# Patient Record
Sex: Male | Born: 1985 | Hispanic: Yes | Marital: Single | Smoking: Never smoker
Health system: Southern US, Community
[De-identification: ages and names within clinical notes are randomized; demographics above are authoritative.]

## PROBLEM LIST (undated history)

## (undated) DIAGNOSIS — F32A Depression, unspecified: Secondary | ICD-10-CM

## (undated) DIAGNOSIS — T7840XA Allergy, unspecified, initial encounter: Secondary | ICD-10-CM

## (undated) DIAGNOSIS — J101 Influenza due to other identified influenza virus with other respiratory manifestations: Secondary | ICD-10-CM

## (undated) DIAGNOSIS — F419 Anxiety disorder, unspecified: Secondary | ICD-10-CM

## (undated) DIAGNOSIS — F319 Bipolar disorder, unspecified: Secondary | ICD-10-CM

## (undated) HISTORY — PX: ADENOIDECTOMY: SUR15

## (undated) HISTORY — DX: Depression, unspecified: F32.A

## (undated) HISTORY — PX: WISDOM TOOTH EXTRACTION: SHX21

## (undated) HISTORY — DX: Allergy, unspecified, initial encounter: T78.40XA

---

## 2013-03-08 ENCOUNTER — Emergency Department (HOSPITAL_COMMUNITY): Payer: BC Managed Care – PPO

## 2013-03-08 ENCOUNTER — Emergency Department (HOSPITAL_COMMUNITY)
Admission: EM | Admit: 2013-03-08 | Discharge: 2013-03-09 | Disposition: A | Discharge status: Home / Self Care | Payer: BC Managed Care – PPO | Attending: Emergency Medicine | Admitting: Emergency Medicine

## 2013-03-08 ENCOUNTER — Encounter (HOSPITAL_COMMUNITY): Payer: Self-pay | Admitting: Emergency Medicine

## 2013-03-08 DIAGNOSIS — R110 Nausea: Secondary | ICD-10-CM | POA: Insufficient documentation

## 2013-03-08 DIAGNOSIS — IMO0001 Reserved for inherently not codable concepts without codable children: Secondary | ICD-10-CM | POA: Insufficient documentation

## 2013-03-08 DIAGNOSIS — R6889 Other general symptoms and signs: Secondary | ICD-10-CM

## 2013-03-08 DIAGNOSIS — J111 Influenza due to unidentified influenza virus with other respiratory manifestations: Secondary | ICD-10-CM | POA: Insufficient documentation

## 2013-03-08 DIAGNOSIS — R197 Diarrhea, unspecified: Secondary | ICD-10-CM | POA: Insufficient documentation

## 2013-03-08 HISTORY — DX: Influenza due to other identified influenza virus with other respiratory manifestations: J10.1

## 2013-03-08 MED ORDER — ACETAMINOPHEN 325 MG PO TABS
650.0000 mg | ORAL_TABLET | Freq: Once | ORAL | Status: AC
  Administered 2013-03-09: 650 mg via ORAL
  Filled 2013-03-08: qty 2

## 2013-03-08 MED ORDER — ONDANSETRON HCL 4 MG/2ML IJ SOLN
4.0000 mg | Freq: Once | INTRAMUSCULAR | Status: AC
  Administered 2013-03-09: 4 mg via INTRAVENOUS
  Filled 2013-03-08: qty 2

## 2013-03-08 MED ORDER — SODIUM CHLORIDE 0.9 % IV BOLUS (SEPSIS)
1000.0000 mL | Freq: Once | INTRAVENOUS | Status: AC
  Administered 2013-03-09: 1000 mL via INTRAVENOUS

## 2013-03-08 MED ORDER — SODIUM CHLORIDE 0.9 % IV BOLUS (SEPSIS): 1000.0000 mL | Freq: Once | INTRAVENOUS | Status: DC

## 2013-03-08 NOTE — ED Provider Notes (Signed)
CSN: [id number]     Arrival date & time 03/08/13  2217 History  This chart was scribed for non-physician practitioner Marlon Pel, PA-C, working with Enid Skeens, MD, by Yevette Edwards, ED Scribe. This patient was seen in room WTR2/WLPT2 and the patient's care was started at 11:33 PM.   First MD Initiated Contact with Patient 03/08/13 2329     Chief Complaint  Patient presents with  . Influenza   The history is provided by the patient. No language interpreter was used.   HPI Comments: Kenneth Crawford is a 27 y.o. male who presents to the Emergency Department complaining of a fever which began yesterday evening. The pt measured his maximum temperature at home at 103 F, and he treated it with IBU. In the ED, his temperature is 99.7 F. The pt has experienced a headache, congestion, rhinorrhea, sore throat, cough, nausea, diarrhea, and myalgia. He has treated his symptoms with Nyquil without relief. The pt denies emesis and otalgia. 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 He received the influenza vaccination this year. The pt has a h/o H1N1 and two bouts of pneumonia. He is a non-smoker.   Past Medical History  Diagnosis Date  . H1N1 influenza     2009   History reviewed. No pertinent past surgical history. No family history on file. History  Substance Use Topics  . Smoking status: Never Smoker   . Smokeless tobacco: Not on file  . Alcohol Use: Yes     Comment: occ    Review of Systems  Constitutional: Positive for fever.  HENT: Positive for congestion, rhinorrhea and sore throat. Negative for ear pain.   Respiratory: Positive for cough.   Gastrointestinal: Positive for nausea and diarrhea. Negative for vomiting.  Musculoskeletal: Positive for myalgias.  Neurological: Positive for headaches.  All other systems reviewed and are negative.   Allergies  Sulfa antibiotics  Home Medications   Current Outpatient Rx  Name  Route  Sig   Dispense  Refill  . DM-Doxylamine-Acetaminophen (NYQUIL COLD & FLU PO)   Oral   Take 30 mLs by mouth every 8 (eight) hours as needed (cold).         Marland Kitchen ibuprofen (ADVIL,MOTRIN) 200 MG tablet   Oral   Take 200 mg by mouth every 6 (six) hours as needed (pain).           Triage Vitals: BP 103/71  Pulse 129  Temp(Src) 99.7 F (37.6 C) (Oral)  Resp 20  Wt 135 lb (61.236 kg)  SpO2 99%  Physical Exam  Nursing note and vitals reviewed. Constitutional: He is oriented to person, place, and time. He appears well-developed and well-nourished. No distress.  HENT:  Head: Normocephalic and atraumatic.  Eyes: EOM are normal.  Neck: Neck supple. No tracheal deviation present.  Cardiovascular: Normal rate.   Pulmonary/Chest: Effort normal. No respiratory distress.  Musculoskeletal: Normal range of motion.  Neurological: He is alert and oriented to person, place, and time.  Skin: Skin is warm and dry.  Psychiatric: He has a normal mood and affect. His behavior is normal.    ED Course  Procedures (including critical care time)  DIAGNOSTIC STUDIES: Oxygen Saturation is 99% on room air, normal by my interpretation.    COORDINATION OF CARE:  11:37 PM- Discussed treatment plan with patient, and the patient agreed to the plan.   Labs Review Labs Reviewed  CBC WITH DIFFERENTIAL - Abnormal; Notable for the  following:    Neutrophils Relative % 79 (*)    Lymphocytes Relative 11 (*)    All other components within normal limits  BASIC METABOLIC PANEL - Abnormal; Notable for the following:    Glucose, Bld 102 (*)    All other components within normal limits   Imaging Review Dg Chest 2 View  03/09/2013   CLINICAL DATA:  Cough, fever  EXAM: CHEST  2 VIEW  COMPARISON:  None.  FINDINGS: The cardiac and mediastinal silhouettes are within normal limits.  The lungs are normally inflated. No airspace consolidation, pleural effusion, or pulmonary edema is identified. There is no pneumothorax.  No  acute osseous abnormality identified.  IMPRESSION: No active cardiopulmonary disease.   Electronically Signed   By: Rise Mu M.D.   On: 03/09/2013 00:12    EKG Interpretation   None       MDM   1. Flu-like symptoms    Patient given 2L NS in the ER as well as Tylenol and Zofran. He is feeling much better. His labs and chest xray are also reassuring.  Patient will be treated symptomatically for home.  Will give note for work, Zofran ODT and Ultram.  27 y.o.[name] evaluation in the Emergency Department is complete. It has been determined that no acute conditions requiring further emergency intervention are present at this time. The patient/guardian have been advised of the diagnosis and plan. We have discussed signs and symptoms that warrant return to the ED, such as changes or worsening in symptoms.  Vital signs are stable at discharge. Filed Vitals:   03/09/13 0157  BP: 115/50  Pulse: 82  Temp: 99 F (37.2 C)  Resp: 16    Patient/guardian has voiced understanding and agreed to follow-up with the PCP or specialist.       Dorthula Matas, PA-C 03/09/13 [phone]

## 2013-03-08 NOTE — ED Notes (Signed)
Pt c/o fever, +nausea, sore throat, cough, body aches, chills onset Thursday night. Pt is employed at [employer]

## 2013-03-09 LAB — BASIC METABOLIC PANEL
BUN: 10 mg/dL (ref 6–23)
CO2: 28 mEq/L (ref 19–32)
Calcium: 9.8 mg/dL (ref 8.4–10.5)
Creatinine, Ser: 1.12 mg/dL (ref 0.50–1.35)
GFR calc non Af Amer: >90 mL/min (ref >90)
Glucose, Bld: 102 mg/dL — ABNORMAL HIGH (ref 70–99)

## 2013-03-09 LAB — CBC WITH DIFFERENTIAL/PLATELET
Basophils Absolute: 0 10*3/uL (ref 0.0–0.1)
Eosinophils Absolute: 0 10*3/uL (ref 0.0–0.7)
Eosinophils Relative: 0 % (ref 0–5)
HCT: 44 % (ref 39.0–52.0)
Lymphocytes Relative: 11 % — ABNORMAL LOW (ref 12–46)
Lymphs Abs: 0.9 10*3/uL (ref 0.7–4.0)
MCH: 28.6 pg (ref 26.0–34.0)
MCHC: 34.8 g/dL (ref 30.0–36.0)
MCV: 82.2 fL (ref 78.0–100.0)
Monocytes Absolute: 0.8 10*3/uL (ref 0.1–1.0)
Monocytes Relative: 10 % (ref 3–12)
RDW: 12.3 % (ref 11.5–15.5)
WBC: 7.7 10*3/uL (ref 4.0–10.5)

## 2013-03-09 MED ORDER — ONDANSETRON HCL 4 MG PO TABS: 4.0000 mg | ORAL_TABLET | Freq: Four times a day (QID) | ORAL | Status: DC

## 2013-03-09 MED ORDER — SODIUM CHLORIDE 0.9 % IV BOLUS (SEPSIS)
1000.0000 mL | Freq: Once | INTRAVENOUS | Status: AC
  Administered 2013-03-09: 1000 mL via INTRAVENOUS

## 2013-03-09 MED ORDER — TRAMADOL HCL 50 MG PO TABS: 50.0000 mg | ORAL_TABLET | Freq: Four times a day (QID) | ORAL | Status: DC | PRN

## 2013-03-09 NOTE — ED Provider Notes (Signed)
Medical screening examination/treatment/procedure(s) were conducted as a shared visit with non-physician practitioner(s) or resident and myself. I personally evaluated the patient during the encounter and agree with the findings and plan unless otherwise indicated.  I have personally reviewed any xrays and/ or EKG's with the provider and I agree with interpretation.  Fever, chills, cough, body aches. 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 Exam mild dry mm, tachycardia, supple neck/ full rom, non toxic appearing, no petechia or purpura.  Fever, Flu like illness   Enid Skeens, MD 03/09/13 [phone]

## 2014-03-01 ENCOUNTER — Encounter (HOSPITAL_COMMUNITY): Payer: Self-pay

## 2014-03-01 ENCOUNTER — Emergency Department (HOSPITAL_COMMUNITY)
Admission: EM | Admit: 2014-03-01 | Discharge: 2014-03-02 | Disposition: A | Discharge status: Home / Self Care | Payer: BC Managed Care – PPO | Attending: Emergency Medicine | Admitting: Emergency Medicine

## 2014-03-01 DIAGNOSIS — F22 Delusional disorders: Secondary | ICD-10-CM | POA: Insufficient documentation

## 2014-03-01 DIAGNOSIS — Z8709 Personal history of other diseases of the respiratory system: Secondary | ICD-10-CM | POA: Insufficient documentation

## 2014-03-01 DIAGNOSIS — F419 Anxiety disorder, unspecified: Secondary | ICD-10-CM | POA: Insufficient documentation

## 2014-03-01 DIAGNOSIS — Z79899 Other long term (current) drug therapy: Secondary | ICD-10-CM | POA: Insufficient documentation

## 2014-03-01 DIAGNOSIS — F319 Bipolar disorder, unspecified: Secondary | ICD-10-CM | POA: Insufficient documentation

## 2014-03-01 DIAGNOSIS — F315 Bipolar disorder, current episode depressed, severe, with psychotic features: Secondary | ICD-10-CM

## 2014-03-01 LAB — CBC
HCT: 44.2 % (ref 39.0–52.0)
HEMOGLOBIN: 15.4 g/dL (ref 13.0–17.0)
MCH: 29 pg (ref 26.0–34.0)
MCHC: 34.8 g/dL (ref 30.0–36.0)
MCV: 83.2 fL (ref 78.0–100.0)
PLATELETS: 259 10*3/uL (ref 150–400)
RBC: 5.31 MIL/uL (ref 4.22–5.81)
RDW: 12.7 % (ref 11.5–15.5)
WBC: 5.3 10*3/uL (ref 4.0–10.5)

## 2014-03-01 LAB — COMPREHENSIVE METABOLIC PANEL
ALK PHOS: 102 U/L (ref 39–117)
ALT: 15 U/L (ref 0–53)
AST: 19 U/L (ref 0–37)
Albumin: 4.9 g/dL (ref 3.5–5.2)
Anion gap: 14 (ref 5–15)
BUN: 14 mg/dL (ref 6–23)
CHLORIDE: 102 meq/L (ref 96–112)
CO2: 26 meq/L (ref 19–32)
Calcium: 10 mg/dL (ref 8.4–10.5)
Creatinine, Ser: 1 mg/dL (ref 0.50–1.35)
GLUCOSE: 101 mg/dL — AB (ref 70–99)
Potassium: 4 mEq/L (ref 3.7–5.3)
SODIUM: 142 meq/L (ref 137–147)
Total Bilirubin: 0.5 mg/dL (ref 0.3–1.2)
Total Protein: 8.3 g/dL (ref 6.0–8.3)

## 2014-03-01 LAB — RAPID URINE DRUG SCREEN, HOSP PERFORMED
AMPHETAMINES: NOT DETECTED
Barbiturates: NOT DETECTED
Benzodiazepines: NOT DETECTED
Cocaine: NOT DETECTED
Opiates: NOT DETECTED
Tetrahydrocannabinol: NOT DETECTED

## 2014-03-01 LAB — ACETAMINOPHEN LEVEL

## 2014-03-01 LAB — SALICYLATE LEVEL: Salicylate Lvl: <2 mg/dL — ABNORMAL LOW (ref 2.8–20.0)

## 2014-03-01 LAB — ETHANOL: Alcohol, Ethyl (B): <11 mg/dL (ref 0–11)

## 2014-03-01 MED ORDER — IBUPROFEN 200 MG PO TABS: 600.0000 mg | ORAL_TABLET | Freq: Three times a day (TID) | ORAL | Status: DC | PRN

## 2014-03-01 MED ORDER — RISPERIDONE 0.5 MG PO TABS
0.5000 mg | ORAL_TABLET | Freq: Two times a day (BID) | ORAL | Status: DC
  Administered 2014-03-01 – 2014-03-02 (×3): 0.5 mg via ORAL
  Filled 2014-03-01 (×3): qty 1

## 2014-03-01 MED ORDER — ALUM & MAG HYDROXIDE-SIMETH 200-200-20 MG/5ML PO SUSP: 30.0000 mL | ORAL | Status: DC | PRN

## 2014-03-01 MED ORDER — ONDANSETRON HCL 4 MG PO TABS: 4.0000 mg | ORAL_TABLET | Freq: Three times a day (TID) | ORAL | Status: DC | PRN

## 2014-03-01 MED ORDER — ACETAMINOPHEN 325 MG PO TABS: 650.0000 mg | ORAL_TABLET | ORAL | Status: DC | PRN

## 2014-03-01 NOTE — Consult Note (Signed)
[facility] Face-to-Face Psychiatry Consult   Reason for Consult:  Anxiety, auditory hallucinations Referring Physician:  EDP Kenneth Crawford is an 28 y.o. male. Total Time spent with patient: 1 hour  Assessment: DSM5 Bipolar disorder, depressed, Anxiety disorder   Past Medical History  Diagnosis Date  . H1N1 influenza     2009    Plan:  No evidence of imminent risk to self or others at present.   Monitor till am and reevaluate for appropriate disposition.  Subjective:   Kenneth Crawford is a 28 y.o. male patient admitted with Bipolar disorder, anxiety, auditory hallucination.  HPI:  28 year old caucasian male was seen for severe anxiety and seeing things and hearing voices.  Patient is also going through some stressors as a result of his divorce and separation.  Patient sees Dr Candis Schatz who prescribes his medications.  Patient states that he has been tried on various Psychotropic medications but had to stop them for one abnormal reaction or another.  Patient reports that the last medication he took was Seroquel which made him have more auditory and visual hallucination.  He stopped taking Seroquel and has not had any medication in about 3 days .  He reports increased anxiety because he is no longer taking his Sertraline.  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  He is unemployed at this time.  He reported some manic symptoms like excessively spending money, not sleeping or eating and mood changes from good mood to depressed mood.  Patient denied using alcohol but stated that he smokes Marijuana occasionally.  He denies SI/HI and denies previous attempt. We have started patient on Risperdal and will reevaluate  his mood in the morning.  Patient is planning to be traveling to [place] to join his family this week.  He denies SI/HI.  HPI Elements:   Location:  anxiety, Bipolar disorder, auditory hallucination, visual hallucination. Quality:   Moderate to severe, anxiety, financial difficulty, marital discord. Severity:  Moderate to severe. Timing:  Acute. Context:  Seeking help with stress, upcoming divorce.  Past Psychiatric History: Past Medical History  Diagnosis Date  . H1N1 influenza     2009    reports that he has never smoked. He does not have any smokeless tobacco history on file. He reports that he drinks alcohol. He reports that he does not use illicit drugs. History reviewed. No pertinent family history. Family History Substance Abuse: Yes, Describe: (Uncles, grandmothers, cousins, aunts, brother) Family Supports: Yes, List: Engineer, petroleum ) Living Arrangements: Alone Can pt return to current living arrangement?: Yes Abuse/Neglect [facility]) Physical Abuse: Denies Verbal Abuse: Denies Sexual Abuse: Denies Allergies:   Allergies  Allergen Reactions  . Seroquel [Quetiapine Fumarate]     "sore throat, blisters in throat, migraine, high levels of aggression"  . Sulfa Antibiotics     unknown  . Sertraline Rash    ACT Assessment Complete:  Yes:    Educational Status    Risk to Self: Risk to self with the past 6 months Suicidal Ideation: Yes-Currently Present Suicidal Intent: No Is patient at risk for suicide?: No Suicidal Plan?: No Access to Means: No What has been your use of drugs/alcohol within the last 12 months?: Pt reported that he binge drink alcohol and occassionally smokes THC.  Previous Attempts/Gestures: No How many times?: 0 Other Self Harm Risks: No self injurious behaviors reported.  Triggers for Past Attempts: None known Intentional Self Injurious Behavior: None Family Suicide  History: No Recent stressful life event(s): Financial Problems, Other (Comment) (Relationship) Persecutory voices/beliefs?: No Depression Symptoms: Insomnia, Tearfulness, Isolating, Fatigue, Guilt, Feeling worthless/self pity, Loss of interest in usual pleasures, Feeling angry/irritable, Despondent Substance abuse history  and/or treatment for substance abuse?: Yes Suicide prevention information given to non-admitted patients: Not applicable  Risk to Others: Risk to Others within the past 6 months Homicidal Ideation: No Thoughts of Harm to Others: No Current Homicidal Intent: No Current Homicidal Plan: No Access to Homicidal Means: No Identified Victim: NA History of harm to others?: No Assessment of Violence: None Noted Violent Behavior Description: No violent behaviors reported. Pt is calm and cooperative at this time.  Does patient have access to weapons?: No Criminal Charges Pending?: No Does patient have a court date: No  Abuse: Abuse/Neglect Assessment (Assessment to be complete while patient is alone) Physical Abuse: Denies Verbal Abuse: Denies Sexual Abuse: Denies Exploitation of patient/patient's resources: Denies Self-Neglect: Denies  Prior Inpatient Therapy: Prior Inpatient Therapy Prior Inpatient Therapy: No  Prior Outpatient Therapy: Prior Outpatient Therapy Prior Outpatient Therapy: Yes Prior Therapy Dates: 2015 Prior Therapy Facilty/Provider(s): Dr. Candis Schatz Reason for Treatment: Bipolar I   Additional Information: Additional Information 1:1 In Past 12 Months?: No CIRT Risk: No Elopement Risk: No Does patient have medical clearance?: Yes   Objective: Blood pressure 131/77, pulse 60, temperature 98.5 F (36.9 C), temperature source Oral, resp. rate 16, height _0  (1.803 m), weight 65.772 kg (145 lb), SpO2 100 %.Body mass index is 20.23 kg/(m^2). Results for orders placed or performed during the hospital encounter of 03/01/14 (from the past 72 hour(s))  Acetaminophen level     Status: None   Collection Time: 03/01/14  4:45 AM  Result Value Ref Range   Acetaminophen (Tylenol), Serum <15.0 10 - 30 ug/mL    Comment:        THERAPEUTIC CONCENTRATIONS VARY SIGNIFICANTLY. A RANGE OF 10-30 ug/mL MAY BE AN EFFECTIVE CONCENTRATION FOR MANY PATIENTS. HOWEVER, SOME ARE BEST  TREATED AT CONCENTRATIONS OUTSIDE THIS RANGE. ACETAMINOPHEN CONCENTRATIONS >150 ug/mL AT 4 HOURS AFTER INGESTION AND >50 ug/mL AT 12 HOURS AFTER INGESTION ARE OFTEN ASSOCIATED WITH TOXIC REACTIONS.   CBC     Status: None   Collection Time: 03/01/14  4:45 AM  Result Value Ref Range   WBC 5.3 4.0 - 10.5 K/uL   RBC 5.31 4.22 - 5.81 MIL/uL   Hemoglobin 15.4 13.0 - 17.0 g/dL   HCT 44.2 39.0 - 52.0 %   MCV 83.2 78.0 - 100.0 fL   MCH 29.0 26.0 - 34.0 pg   MCHC 34.8 30.0 - 36.0 g/dL   RDW 12.7 11.5 - 15.5 %   Platelets 259 150 - 400 K/uL  Comprehensive metabolic panel     Status: Abnormal   Collection Time: 03/01/14  4:45 AM  Result Value Ref Range   Sodium 142 137 - 147 mEq/L   Potassium 4.0 3.7 - 5.3 mEq/L   Chloride 102 96 - 112 mEq/L   CO2 26 19 - 32 mEq/L   Glucose, Bld 101 (H) 70 - 99 mg/dL   BUN 14 6 - 23 mg/dL   Creatinine, Ser 1.00 0.50 - 1.35 mg/dL   Calcium 10.0 8.4 - 10.5 mg/dL   Total Protein 8.3 6.0 - 8.3 g/dL   Albumin 4.9 3.5 - 5.2 g/dL   AST 19 0 - 37 U/L   ALT 15 0 - 53 U/L   Alkaline Phosphatase 102 39 - 117 U/L   Total Bilirubin  0.5 0.3 - 1.2 mg/dL   GFR calc non Af Amer >90 >90 mL/min   GFR calc Af Amer >90 >90 mL/min    Comment: (NOTE) The eGFR has been calculated using the CKD EPI equation. This calculation has not been validated in all clinical situations. eGFR's persistently <90 mL/min signify possible Chronic Kidney Disease.    Anion gap 14 5 - 15  Ethanol (ETOH)     Status: None   Collection Time: 03/01/14  4:45 AM  Result Value Ref Range   Alcohol, Ethyl (B) <11 0 - 11 mg/dL    Comment:        LOWEST DETECTABLE LIMIT FOR SERUM ALCOHOL IS 11 mg/dL FOR MEDICAL PURPOSES ONLY   Salicylate level     Status: Abnormal   Collection Time: 03/01/14  4:45 AM  Result Value Ref Range   Salicylate Lvl <4.4 (L) 2.8 - 20.0 mg/dL  Urine Drug Screen     Status: None   Collection Time: 03/01/14  5:34 AM  Result Value Ref Range   Opiates NONE DETECTED  NONE DETECTED   Cocaine NONE DETECTED NONE DETECTED   Benzodiazepines NONE DETECTED NONE DETECTED   Amphetamines NONE DETECTED NONE DETECTED   Tetrahydrocannabinol NONE DETECTED NONE DETECTED   Barbiturates NONE DETECTED NONE DETECTED    Comment:        DRUG SCREEN FOR MEDICAL PURPOSES ONLY.  IF CONFIRMATION IS NEEDED FOR ANY PURPOSE, NOTIFY LAB WITHIN 5 DAYS.        LOWEST DETECTABLE LIMITS FOR URINE DRUG SCREEN Drug Class       Cutoff (ng/mL) Amphetamine      1000 Barbiturate      200 Benzodiazepine   034 Tricyclics       742 Opiates          300 Cocaine          300 THC              50    Labs are reviewed and are pertinent for Unremarkable.  Current Facility-Administered Medications  Medication Dose Route Frequency Provider Last Rate Last Dose  . acetaminophen (TYLENOL) tablet 650 mg  650 mg Oral Q4H PRN Kalman Drape, MD      . alum & mag hydroxide-simeth (MAALOX/MYLANTA) 200-200-20 MG/5ML suspension 30 mL  30 mL Oral PRN Kalman Drape, MD      . ibuprofen (ADVIL,MOTRIN) tablet 600 mg  600 mg Oral Q8H PRN Kalman Drape, MD      . ondansetron [facility]) tablet 4 mg  4 mg Oral Q8H PRN Kalman Drape, MD      . risperiDONE (RISPERDAL) tablet 0.5 mg  0.5 mg Oral BID Waylan Boga, NP   0.5 mg at 03/01/14 1528   Current Outpatient Prescriptions  Medication Sig Dispense Refill  . venlafaxine XR (EFFEXOR-XR) 37.5 MG 24 hr capsule Take 2 capsules by mouth daily.  0  . ondansetron (ZOFRAN) 4 MG tablet Take 1 tablet (4 mg total) by mouth every 6 (six) hours. (Patient not taking: Reported on 03/01/2014) 12 tablet 0  . traMADol (ULTRAM) 50 MG tablet Take 1 tablet (50 mg total) by mouth every 6 (six) hours as needed. (Patient not taking: Reported on 03/01/2014) 15 tablet 0    Psychiatric Specialty Exam:     Blood pressure 131/77, pulse 60, temperature 98.5 F (36.9 C), temperature source Oral, resp. rate 16, height _0  (1.803 m), weight 65.772 kg (145 lb), SpO2 100 %.Body mass  index  is 20.23 kg/(m^2).  General Appearance: Casual  Eye Contact::  Good  Speech:  Clear and Coherent and Normal Rate  Volume:  Normal  Mood:  Anxious and Depressed  Affect:  Congruent, Depressed and Flat  Thought Process:  Coherent, Goal Directed and Intact  Orientation:  Full (Time, Place, and Person)  Thought Content:  Hallucinations: Auditory Visual  Suicidal Thoughts:  No  Homicidal Thoughts:  No  Memory:  Immediate;   Good Recent;   Good Remote;   Good  Judgement:  Fair  Insight:  Fair  Psychomotor Activity:  Normal  Concentration:  Good  Recall:  NA  Fund of Knowledge:Good  Language: Good  Akathisia:  NA  Handed:  Right  AIMS (if indicated):     Assets:  Desire for Improvement  Sleep:      Musculoskeletal: Strength & Muscle Tone: within normal limits Gait & Station: normal Patient leans: N/A  Treatment Plan Summary: Daily contact with patient to assess and evaluate symptoms and progress in treatment Medication management Plan is to reevaluate in am and make appropriate disposition.  Delfin Gant   PMHNP-BC 03/01/2014 4:45 PM  Patient seen, evaluated and I agree with notes by Nurse Practitioner. Corena Pilgrim, MD

## 2014-03-01 NOTE — ED Notes (Signed)
Pt states currently that he is not suicidal because he doesn't have the guts to do anything like that. He states that he can't take sertraline because he has a reaction to it but he thinks that works the best, he says seroquel makes him angry.

## 2014-03-01 NOTE — ED Notes (Signed)
Patient has been up and walking about some, interacting some with peers in the dayroom.  Started on risperidone.  Tolerating medicine well thus far.  Has been cooperative.

## 2014-03-01 NOTE — ED Notes (Signed)
Pt belongings place behind Nurses station across from room 16.  Pt's belongings consist of one bag.

## 2014-03-01 NOTE — ED Notes (Signed)
Pt was changed into scrubs.  Pt has been wanded and seen by security.  Pt has one bag with belongings.  Pt has ipad and phone in safe.

## 2014-03-01 NOTE — BH Assessment (Signed)
Assessment completed. Psychiatric evaluation is recommended. Informed Dr. Norlene Campbelltter of recommendation.

## 2014-03-01 NOTE — ED Notes (Signed)
Pt called [facility] and stated that he was suicidal, pt is here voluntarily brought in by the police. Pt states that he doesn't want to take his medications anymore because they make him angry.

## 2014-03-01 NOTE — ED Provider Notes (Signed)
CSN: [id number]     Arrival date & time 03/01/14  0410 History   First MD Initiated Contact with Patient 03/01/14 0445     Chief Complaint  Patient presents with  . Suicidal     (Consider location/radiation/quality/duration/timing/severity/associated sxs/prior Treatment) HPI 28 year old male presents to emergency department from home via EMS with complaint of suicidal thoughts.  Patient corrects this, however, and reports that he is having auditory and visual hallucinations that are disturbing to him.  Patient has history of bipolar disorder for which she is on Effexor.  He reports he has had auditory hallucinations for some time.  He reports that there are voices of people that he knows that are asking him for help.  Patient reports these voices are like hearing or memory.  Although sometimes concerning or alarming as centimeters her calling for help, he reports it.  They've never disturbed him very much before.  Tonight, however he had a visual hallucination.  This scared him quite badly.  He reports that he was taking a shower and felt as if someone was in the bathroom with him watching him.  Patient lives alone.  Patient reports when he looked out the clear shower curtain.  He did see someone standing there.  When he opened it, he could see himself standing outside a shower staring back at a minimum any menacing way.  He reports that this other bad version of him stole memories from him.  Patient reports he is unable to remember the title or band of his favorite song.  He reports that the bad version of him began to whistle.  This long in an aggressive way.  He screamed and the bad version of him screamed back.  Patient reports that he did not feel safe in his apartment.  He reports that he felt the only way to get rid of this visual hallucination was to kill himself, in order to eliminate one copy of him.  Patient reports speaking of this bad version of him, makes him feel that he will reappear here  in the emergency room.  He reports that he feels better around people, as he is able to rationalize the visual hallucination as not real.  He denies depression or suicidal thoughts other than wanting to get rid of the bad hallucination. Past Medical History  Diagnosis Date  . H1N1 influenza     2009   History reviewed. No pertinent past surgical history. History reviewed. No pertinent family history. History  Substance Use Topics  . Smoking status: Never Smoker   . Smokeless tobacco: Not on file  . Alcohol Use: Yes     Comment: occ    Review of Systems   See History of Present Illness; otherwise all other systems are reviewed and negative  Allergies  Seroquel; Sulfa antibiotics; and Sertraline  Home Medications   Prior to Admission medications   Medication Sig Start Date End Date Taking? Authorizing Provider  venlafaxine XR (EFFEXOR-XR) 37.5 MG 24 hr capsule Take 2 capsules by mouth daily. 01/04/14  Yes Historical Provider, MD  ondansetron (ZOFRAN) 4 MG tablet Take 1 tablet (4 mg total) by mouth every 6 (six) hours. Patient not taking: Reported on 03/01/2014 03/09/13   Dorthula Matasiffany G Greene, PA-C  traMADol (ULTRAM) 50 MG tablet Take 1 tablet (50 mg total) by mouth every 6 (six) hours as needed. Patient not taking: Reported on 03/01/2014 03/09/13   Dorthula Matasiffany G Greene, PA-C   BP 137/88 mmHg  Pulse 86  Temp(Src)  98.5 F (36.9 C) (Oral)  Resp 20  Ht 5\' 11"  (1.803 m)  Wt 145 lb (65.772 kg)  BMI 20.23 kg/m2  SpO2 100% Physical Exam  Constitutional: He is oriented to person, place, and time. He appears well-developed and well-nourished. He appears distressed.  HENT:  Head: Normocephalic and atraumatic.  Nose: Nose normal.  Mouth/Throat: Oropharynx is clear and moist.  Eyes: Conjunctivae and EOM are normal. Pupils are equal, round, and reactive to light.  Neck: Normal range of motion. Neck supple. No JVD present. No tracheal deviation present. No thyromegaly present.   Cardiovascular: Normal rate, regular rhythm, normal heart sounds and intact distal pulses.  Exam reveals no gallop and no friction rub.   No murmur heard. Pulmonary/Chest: Effort normal and breath sounds normal. No stridor. No respiratory distress. He has no wheezes. He has no rales. He exhibits no tenderness.  Abdominal: Soft. Bowel sounds are normal. He exhibits no distension and no mass. There is no tenderness. There is no rebound and no guarding.  Musculoskeletal: Normal range of motion. He exhibits no edema or tenderness.  Lymphadenopathy:    He has no cervical adenopathy.  Neurological: He is alert and oriented to person, place, and time. He displays normal reflexes. He exhibits normal muscle tone. Coordination normal.  Skin: Skin is warm and dry. No rash noted. No erythema. No pallor.  Psychiatric:  Patient appears anxious, afraid.  Patient reports altering and visual hallucinations.  Patient appears to have fairly good insight to know that these are not real, but reports that when he is alone.  He has difficulties differentiating imaginary from real.  Nursing note and vitals reviewed.   ED Course  Procedures (including critical care time) Labs Review Labs Reviewed  COMPREHENSIVE METABOLIC PANEL - Abnormal; Notable for the following:    Glucose, Bld 101 (*)    All other components within normal limits  SALICYLATE LEVEL - Abnormal; Notable for the following:    Salicylate Lvl <2.0 (*)    All other components within normal limits  ACETAMINOPHEN LEVEL  CBC  ETHANOL  URINE RAPID DRUG SCREEN (HOSP PERFORMED)    Imaging Review No results found.   EKG Interpretation None      MDM   Final diagnoses:  Delusional disorder    28 year old male with acute visual hallucination, delusion.  He has had thoughts of killing himself to rid the hallucination.  Patient is willing to stay to be evaluated by psychiatry.  I feel that should he attempt to leave, he will need to be  involuntary committed.  Patient requests a sitter as he does not want to be alone with his bad self.  Psych consult ordered.  Holding orders written.    Olivia Mackielga M Bao Coreas, MD 03/01/14 [phone]

## 2014-03-01 NOTE — ED Notes (Signed)
Patient pleasant, cooperative. Has been sleeping. Denies SI, HI, AVH. Denies feelings of anxiety and depression. States that his sleep was very poor prior to coming in to the hospital. Reports decreased appetite while he was taking Seroquel because of nausea. Estimates losing approximately 7 lbs.  Encouragement offered. Risperdal given.  Q 15 safety checks continue.

## 2014-03-01 NOTE — BH Assessment (Signed)
Tele Assessment Note   Kenneth Crawford is an 28 y.o. male presenting to [facility] ED reporting severe anxiety. Pt stated "I had a panic attack but a little worst". "I think it was an optical illusion, I guess". "It doesn't make a lot of sense but I am fine now". Pt also stated "my mind was trying to put together a representation of me". "I felt like it was trying to attack me". "I y few months. July/Dec.2015 1 - Last Use / Amount: 12/15  CIWA: CIWA-Ar BP: 137/88 mmHg Pulse Rate: 86 COWS:    PATIENT STRENGTHS: (choose at least two) Average or above average intelligence Motivation for treatment/growth  Allergies:  Allergies  Allergen Reactions  . Seroquel [Quetiapine Fumarate]     "sore throat, blisters in throat, migraine, high levels of aggression"  . Sulfa Antibiotics     unknown  . Sertraline Rash    Home Medications:  (Not in a hospital admission)  OB/GYN Status:  No LMP for male patient.  General Assessment Data Location of Assessment: [facility] ED Is this a Tele or Face-to-Face Assessment?: Face-to-Face Is this an Initial Assessment or a Re-assessment for this encounter?: Initial Assessment Living Arrangements: Alone Can pt return to current living arrangement?: Yes Admission Status: Voluntary Is patient capable of signing voluntary admission?: Yes Transfer from: Home Referral Source: Self/Family/Friend     [facility] Crisis Care Plan  Living Arrangements: Alone Name of Psychiatrist: Dr. Tomasa Randunningham Name of Therapist: No provider reported at this time.   Education Status Is patient currently in school?: No  Risk to self with the past 6 months Suicidal Ideation: Yes-Currently Present Suicidal Intent: No Is patient at risk for suicide?: No Suicidal Plan?: No Access to Means: No What has been your use of drugs/alcohol within the last 12 months?: Pt reported that he binge drink alcohol and  occassionally smokes THC.  Previous Attempts/Gestures: No How many times?: 0 Other Self Harm Risks: No self injurious behaviors reported.  Triggers for Past Attempts: None known Intentional Self Injurious Behavior: None Family Suicide History: No Recent stressful life event(Crawford): Financial Problems, Other (Comment) (Relationship) Persecutory voices/beliefs?: No Depression Symptoms: Insomnia, Tearfulness, Isolating, Fatigue, Guilt, Feeling worthless/self pity, Loss of interest in usual pleasures, Feeling angry/irritable, Despondent Substance abuse history and/or treatment for substance abuse?: Yes Suicide prevention information given to non-admitted patients: Not applicable  Risk to Others within the past 6 months Homicidal Ideation: No Thoughts of Harm to Others: No Current Homicidal Intent: No Current Homicidal Plan: No Access to Homicidal Means: No Identified Victim: NA History of harm to others?: No Assessment of Violence: None Noted Violent Behavior Description: No violent behaviors reported. Pt is calm and cooperative at this time.  Does patient have access to weapons?: No Criminal Charges Pending?: No Does patient have a court date: No  Psychosis Hallucinations: Auditory, Visual Delusions: None noted  Mental Status Report Appear/Hygiene: In scrubs Eye Contact: Good Motor Activity: Freedom of movement Speech: Logical/coherent Level of Consciousness: Quiet/awake Mood: Euthymic Affect: Appropriate to circumstance Anxiety Level: Minimal Thought Processes: Coherent, Relevant Judgement: Unimpaired Orientation: Place, Time, Person, Situation Obsessive Compulsive Thoughts/Behaviors: None  Cognitive Functioning Concentration: Decreased Memory: Recent Intact, Remote Intact IQ: Average Insight: Fair Impulse Control: Good Appetite: Poor Weight Loss: 0 Weight Gain: 0 Sleep: Decreased Total Hours of Sleep: 4 Vegetative Symptoms: Not bathing, Decreased  grooming  ADLScreening [facility] Assessment Services) Patient'Crawford cognitive ability adequate to safely complete daily activities?: Yes Patient able to express need for assistance with ADLs?: Yes Independently performs ADLs?: Yes (appropriate for developmental age)  Prior Inpatient Therapy Prior Inpatient Therapy: No  Prior Outpatient Therapy Prior Outpatient Therapy: Yes Prior Therapy Dates: 2015 Prior Therapy Facilty/Provider(Crawford): Dr. Tomasa Randunningham Reason for Treatment: Bipolar I   ADL Screening (condition at time of admission) Patient'Crawford cognitive ability adequate to safely complete daily activities?: Yes Is the patient deaf or have difficulty hearing?: No Does the patient have difficulty seeing, even when wearing glasses/contacts?: No Does the patient have difficulty concentrating, remembering, or making decisions?: No Patient able to express need for assistance with ADLs?: Yes Does the patient have difficulty dressing or bathing?: No Independently performs ADLs?: Yes (appropriate for developmental age) Does the patient have difficulty walking or climbing stairs?: No       Abuse/Neglect Assessment (Assessment to be complete while patient is alone) Physical Abuse: Denies Verbal Abuse: Denies Sexual Abuse: Denies Exploitation of patient/patient'Crawford resources: Denies Self-Neglect: Denies          Additional Information 1:1 In Past 12 Months?: No CIRT Risk: No Elopement Risk: No Does patient have medical clearance?: Yes     Disposition: Psychiatric evaluation.  Disposition Initial Assessment Completed for this Encounter: Yes  Kenneth Crawford 03/01/2014 7:29 AM

## 2014-03-01 NOTE — ED Notes (Signed)
Patient transferred over from main ED.  States he has a history of bipolar 1 disorder and went off quetiapine a few days ago because he believed he was having an allergic reaction.  Denies hallucinations at this time, but has had them in the past.  States when he does have visual or auditory hallucinations he is aware that they are not real, but they still bother him.  Patient is concerned about getting out of the hospital in time to [place] to [place] on the 16th to be with his family.  Counselor and providers are aware.

## 2014-03-02 MED ORDER — RISPERIDONE 0.5 MG PO TABS: 0.5000 mg | ORAL_TABLET | Freq: Two times a day (BID) | ORAL | Status: DC

## 2014-03-02 NOTE — Discharge Instructions (Signed)
For your ongoing mental health needs, you are advised to continue your treatment with Tiajuana AmassScott Cunningham, MD at [facility].  You have indicated that you have a previously scheduled appointment on Monday, 03/22/2013 at 3:00 pm.       [facility]      [address]      [place] [address]       [phone]

## 2014-03-02 NOTE — BH Assessment (Signed)
[facility] Assessment Progress Note  Per Thedore MinsMojeed Akintayo, MD, pt is to be discharged from ED and is to follow up with his current provider.  Pt currently sees Tiajuana AmassScott Cunningham, MD at [facility], to whom pt signed Consent to Release Information.  Pt reports that he has a pre-existing appointment on 03/22/2014 at 15:00.  This information has been included in pt's discharge instructions.  Doylene Canninghomas Cecil Vandyke, MA Triage Specialist 03/02/2014 @ 15:31

## 2014-03-02 NOTE — Consult Note (Signed)
[facility] Face-to-Face Psychiatry discharge note  Kenneth Crawford is an 28 y.o. male. Total Time spent with patient: 1 hour  Assessment: DSM5 Bipolar disorder, depressed, Anxiety disorder   Past Medical History  Diagnosis Date  . H1N1 influenza     2009    Plan:  No evidence of imminent risk to self or others at present.   Monitor till am and reevaluate for appropriate disposition.  Subjective:   Kenneth Crawford is a 28 y.o. male patient admitted with Bipolar disorder, anxiety, auditory hallucination.  HPI:  28 year old caucasian male was seen for severe anxiety and seeing things and hearing voices.  Patient is also going through some stressors as a result of his divorce and separation.  Patient sees Dr Candis Schatz who prescribes his medications.  Patient states that he has been tried on various Psychotropic medications but had to stop them for one abnormal reaction or another.  Patient reports that the last medication he took was Seroquel which made him have more auditory and visual hallucination.  He stopped taking Seroquel and has not had any medication in about 3 days .  He reports increased anxiety because he is no longer taking his Sertraline.  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  He is unemployed at this time.  He reported some manic symptoms like excessively spending money, not sleeping or eating and mood changes from good mood to depressed mood.  Patient denied using alcohol but stated that he smokes Marijuana occasionally.  He denies SI/HI and denies previous attempt. We have started patient on Risperdal and will reevaluate  his mood in the morning.  Patient is planning to be traveling to [place] to join his family this week.  He denies SI/HI.  Today, patient reports he is feeling much better since he started taking Risperdal.  Patient reported that he slept very well last night.  He denied SI/HI/AVH.  Patient states that he is  willing to continue taking his Risperdal.  We discussed the side effects of Risperdal in reference to metabolic syndrome.  Patient verbalized understanding.  He will follow up with his Psychiatrist on the 4th of January.  Patient will be discharged now  HPI Elements:   Location:  anxiety, Bipolar disorder, auditory hallucination, visual hallucination. Quality:  Moderate to severe, anxiety, financial difficulty, marital discord. Severity:  Moderate to severe. Timing:  Acute. Context:  Seeking help with stress, upcoming divorce.  Past Psychiatric History: Past Medical History  Diagnosis Date  . H1N1 influenza     2009    reports that he has never smoked. He does not have any smokeless tobacco history on file. He reports that he drinks alcohol. He reports that he does not use illicit drugs. History reviewed. No pertinent family history. Family History Substance Abuse: Yes, Describe: (Uncles, grandmothers, cousins, aunts, brother) Family Supports: Yes, List: Engineer, petroleum ) Living Arrangements: Alone Can pt return to current living arrangement?: Yes Abuse/Neglect [facility]) Physical Abuse: Denies Verbal Abuse: Denies Sexual Abuse: Denies Allergies:   Allergies  Allergen Reactions  . Seroquel [Quetiapine Fumarate]     "sore throat, blisters in throat, migraine, high levels of aggression"  . Sulfa Antibiotics     unknown  . Sertraline Rash    ACT Assessment Complete:  Yes:    Educational Status    Risk to Self: Risk to self with the past 6 months Suicidal Ideation: Yes-Currently Present Suicidal Intent: No Is patient at  risk for suicide?: No Suicidal Plan?: No Access to Means: No What has been your use of drugs/alcohol within the last 12 months?: Pt reported that he binge drink alcohol and occassionally smokes THC.  Previous Attempts/Gestures: No How many times?: 0 Other Self Harm Risks: No self injurious behaviors reported.  Triggers for Past Attempts: None known Intentional Self  Injurious Behavior: None Family Suicide History: No Recent stressful life event(s): Financial Problems, Other (Comment) (Relationship) Persecutory voices/beliefs?: No Depression Symptoms: Insomnia, Tearfulness, Isolating, Fatigue, Guilt, Feeling worthless/self pity, Loss of interest in usual pleasures, Feeling angry/irritable, Despondent Substance abuse history and/or treatment for substance abuse?: No Suicide prevention information given to non-admitted patients: Not applicable  Risk to Others: Risk to Others within the past 6 months Homicidal Ideation: No Thoughts of Harm to Others: No Current Homicidal Intent: No Current Homicidal Plan: No Access to Homicidal Means: No Identified Victim: NA History of harm to others?: No Assessment of Violence: None Noted Violent Behavior Description: No violent behaviors reported. Pt is calm and cooperative at this time.  Does patient have access to weapons?: No Criminal Charges Pending?: No Does patient have a court date: No  Abuse: Abuse/Neglect Assessment (Assessment to be complete while patient is alone) Physical Abuse: Denies Verbal Abuse: Denies Sexual Abuse: Denies Exploitation of patient/patient's resources: Denies Self-Neglect: Denies  Prior Inpatient Therapy: Prior Inpatient Therapy Prior Inpatient Therapy: No  Prior Outpatient Therapy: Prior Outpatient Therapy Prior Outpatient Therapy: Yes Prior Therapy Dates: 2015 Prior Therapy Facilty/Provider(s): Dr. Candis Schatz Reason for Treatment: Bipolar I   Additional Information: Additional Information 1:1 In Past 12 Months?: No CIRT Risk: No Elopement Risk: No Does patient have medical clearance?: Yes   Objective: Blood pressure 125/79, pulse 69, temperature 97.8 F (36.6 C), temperature source Oral, resp. rate 16, height 5' 11"  (1.803 m), weight 65.772 kg (145 lb), SpO2 100 %.Body mass index is 20.23 kg/(m^2). Results for orders placed or performed during the hospital encounter of  03/01/14 (from the past 72 hour(s))  Acetaminophen level     Status: None   Collection Time: 03/01/14  4:45 AM  Result Value Ref Range   Acetaminophen (Tylenol), Serum <15.0 10 - 30 ug/mL    Comment:        THERAPEUTIC CONCENTRATIONS VARY SIGNIFICANTLY. A RANGE OF 10-30 ug/mL MAY BE AN EFFECTIVE CONCENTRATION FOR MANY PATIENTS. HOWEVER, SOME ARE BEST TREATED AT CONCENTRATIONS OUTSIDE THIS RANGE. ACETAMINOPHEN CONCENTRATIONS >150 ug/mL AT 4 HOURS AFTER INGESTION AND >50 ug/mL AT 12 HOURS AFTER INGESTION ARE OFTEN ASSOCIATED WITH TOXIC REACTIONS.   CBC     Status: None   Collection Time: 03/01/14  4:45 AM  Result Value Ref Range   WBC 5.3 4.0 - 10.5 K/uL   RBC 5.31 4.22 - 5.81 MIL/uL   Hemoglobin 15.4 13.0 - 17.0 g/dL   HCT 44.2 39.0 - 52.0 %   MCV 83.2 78.0 - 100.0 fL   MCH 29.0 26.0 - 34.0 pg   MCHC 34.8 30.0 - 36.0 g/dL   RDW 12.7 11.5 - 15.5 %   Platelets 259 150 - 400 K/uL  Comprehensive metabolic panel     Status: Abnormal   Collection Time: 03/01/14  4:45 AM  Result Value Ref Range   Sodium 142 137 - 147 mEq/L   Potassium 4.0 3.7 - 5.3 mEq/L   Chloride 102 96 - 112 mEq/L   CO2 26 19 - 32 mEq/L   Glucose, Bld 101 (H) 70 - 99 mg/dL   BUN 14 6 -  23 mg/dL   Creatinine, Ser 1.00 0.50 - 1.35 mg/dL   Calcium 10.0 8.4 - 10.5 mg/dL   Total Protein 8.3 6.0 - 8.3 g/dL   Albumin 4.9 3.5 - 5.2 g/dL   AST 19 0 - 37 U/L   ALT 15 0 - 53 U/L   Alkaline Phosphatase 102 39 - 117 U/L   Total Bilirubin 0.5 0.3 - 1.2 mg/dL   GFR calc non Af Amer >90 >90 mL/min   GFR calc Af Amer >90 >90 mL/min    Comment: (NOTE) The eGFR has been calculated using the CKD EPI equation. This calculation has not been validated in all clinical situations. eGFR's persistently <90 mL/min signify possible Chronic Kidney Disease.    Anion gap 14 5 - 15  Ethanol (ETOH)     Status: None   Collection Time: 03/01/14  4:45 AM  Result Value Ref Range   Alcohol, Ethyl (B) <11 0 - 11 mg/dL    Comment:         LOWEST DETECTABLE LIMIT FOR SERUM ALCOHOL IS 11 mg/dL FOR MEDICAL PURPOSES ONLY   Salicylate level     Status: Abnormal   Collection Time: 03/01/14  4:45 AM  Result Value Ref Range   Salicylate Lvl <1.9 (L) 2.8 - 20.0 mg/dL  Urine Drug Screen     Status: None   Collection Time: 03/01/14  5:34 AM  Result Value Ref Range   Opiates NONE DETECTED NONE DETECTED   Cocaine NONE DETECTED NONE DETECTED   Benzodiazepines NONE DETECTED NONE DETECTED   Amphetamines NONE DETECTED NONE DETECTED   Tetrahydrocannabinol NONE DETECTED NONE DETECTED   Barbiturates NONE DETECTED NONE DETECTED    Comment:        DRUG SCREEN FOR MEDICAL PURPOSES ONLY.  IF CONFIRMATION IS NEEDED FOR ANY PURPOSE, NOTIFY LAB WITHIN 5 DAYS.        LOWEST DETECTABLE LIMITS FOR URINE DRUG SCREEN Drug Class       Cutoff (ng/mL) Amphetamine      1000 Barbiturate      200 Benzodiazepine   622 Tricyclics       297 Opiates          300 Cocaine          300 THC              50    Labs are reviewed and are pertinent for Unremarkable.  Current Facility-Administered Medications  Medication Dose Route Frequency Provider Last Rate Last Dose  . acetaminophen (TYLENOL) tablet 650 mg  650 mg Oral Q4H PRN Kalman Drape, MD      . alum & mag hydroxide-simeth (MAALOX/MYLANTA) 200-200-20 MG/5ML suspension 30 mL  30 mL Oral PRN Kalman Drape, MD      . ibuprofen (ADVIL,MOTRIN) tablet 600 mg  600 mg Oral Q8H PRN Kalman Drape, MD      . ondansetron [facility]) tablet 4 mg  4 mg Oral Q8H PRN Kalman Drape, MD      . risperiDONE (RISPERDAL) tablet 0.5 mg  0.5 mg Oral BID Waylan Boga, NP   0.5 mg at 03/02/14 1022   Current Outpatient Prescriptions  Medication Sig Dispense Refill  . venlafaxine XR (EFFEXOR-XR) 37.5 MG 24 hr capsule Take 2 capsules by mouth daily.  0  . ondansetron (ZOFRAN) 4 MG tablet Take 1 tablet (4 mg total) by mouth every 6 (six) hours. (Patient not taking: Reported on 03/01/2014) 12 tablet 0  . traMADol (ULTRAM) 50  MG tablet Take 1 tablet (50 mg total) by mouth every 6 (six) hours as needed. (Patient not taking: Reported on 03/01/2014) 15 tablet 0    Psychiatric Specialty Exam:     Blood pressure 125/79, pulse 69, temperature 97.8 F (36.6 C), temperature source Oral, resp. rate 16, height 5' 11"  (1.803 m), weight 65.772 kg (145 lb), SpO2 100 %.Body mass index is 20.23 kg/(m^2).  General Appearance: Casual  Eye Contact::  Good  Speech:  Clear and Coherent and Normal Rate  Volume:  Normal  Mood:  Anxious and Depressed  Affect:  Congruent, Depressed and Flat  Thought Process:  Coherent, Goal Directed and Intact  Orientation:  Full (Time, Place, and Person)  Thought Content:  WDL  Suicidal Thoughts:  No  Homicidal Thoughts:  No  Memory:  Immediate;   Good Recent;   Good Remote;   Good  Judgement:  Fair  Insight:  Fair  Psychomotor Activity:  Normal  Concentration:  Good  Recall:  NA  Fund of Knowledge:Good  Language: Good  Akathisia:  NA  Handed:  Right  AIMS (if indicated):     Assets:  Desire for Improvement  Sleep:      Musculoskeletal: Strength & Muscle Tone: within normal limits Gait & Station: normal Patient leans: N/A  Treatment Plan Summary: Will discharge patient home today with prescription for Risperdal.  Patient will follow up with Dr Candis Schatz who is her outpatient Psychiatrist.   Delfin Gant   PMHNP-BC 03/02/2014 12:21 PM   Patient seen, evaluated and I agree with notes by Nurse Practitioner. Corena Pilgrim, MD

## 2014-03-02 NOTE — [facility] Suicide Risk Assessment (Cosign Needed)
Suicide Risk Assessment  Discharge Assessment     Demographic Factors:  Male and Unemployed  Total Time spent with patient: 30 minutes  Psychiatric Specialty Exam:     Blood pressure 125/79, pulse 69, temperature 97.8 F (36.6 C), temperature source Oral, resp. rate 16, height 5\' 11"  (1.803 m), weight 65.772 kg (145 lb), SpO2 100 %.Body mass index is 20.23 kg/(m^2).  General Appearance: Casual  Eye Contact::  Good  Speech:  Clear and Coherent and Normal Rate  Volume:  Normal  Mood:  Depressed  Affect:  Congruent  Thought Process:  Coherent, Goal Directed and Intact  Orientation:  Full (Time, Place, and Person)  Thought Content:  WDL  Suicidal Thoughts:  No  Homicidal Thoughts:  No  Memory:  Immediate;   Good Recent;   Good Remote;   Good  Judgement:  Good  Insight:  Good  Psychomotor Activity:  Normal  Concentration:  Good  Recall:  NA  Fund of Knowledge:Good  Language: Good  Akathisia:  NA  Handed:  Right  AIMS (if indicated):     Assets:  Desire for Improvement  Sleep:       Musculoskeletal: Strength & Muscle Tone: within normal limits Gait & Station: normal Patient leans: N/A   Mental Status Per Nursing Assessment::   On Admission:     Current Mental Status by Physician: NA  Loss Factors: NA  Historical Factors: NA  Risk Reduction Factors:   Responsible for children under 818 years of age, Positive social support, Positive therapeutic relationship and Positive coping skills or problem solving skills  Continued Clinical Symptoms:  Depression:   Insomnia  Cognitive Features That Contribute To Risk:  Polarized thinking    Suicide Risk:  Minimal: No identifiable suicidal ideation.  Patients presenting with no risk factors but with morbid ruminations; may be classified as minimal risk based on the severity of the depressive symptoms  Discharge Diagnoses:   Bipolar disorder, depressed     Past Medical History  Diagnosis Date  . H1N1 influenza      2009   Plan Of Care/Follow-up recommendations:  Activity:  as tolerated Diet:  regular  Is patient on multiple antipsychotic therapies at discharge:  No   Has Patient had three or more failed trials of antipsychotic monotherapy by history:  No  Recommended Plan for Multiple Antipsychotic Therapies: NA   Dahlia ByesONUOHA, Jeilani Grupe, C   PMHNP-BC 03/02/2014, 12:39 PM

## 2016-03-31 ENCOUNTER — Encounter (HOSPITAL_COMMUNITY): Payer: Self-pay | Admitting: Emergency Medicine

## 2016-03-31 ENCOUNTER — Emergency Department (HOSPITAL_COMMUNITY)
Admission: EM | Admit: 2016-03-31 | Discharge: 2016-03-31 | Disposition: A | Discharge status: Home / Self Care | Payer: BLUE CROSS/BLUE SHIELD | Attending: Emergency Medicine | Admitting: Emergency Medicine

## 2016-03-31 DIAGNOSIS — R1013 Epigastric pain: Secondary | ICD-10-CM | POA: Diagnosis not present

## 2016-03-31 DIAGNOSIS — R112 Nausea with vomiting, unspecified: Secondary | ICD-10-CM | POA: Diagnosis present

## 2016-03-31 DIAGNOSIS — Z79899 Other long term (current) drug therapy: Secondary | ICD-10-CM | POA: Insufficient documentation

## 2016-03-31 DIAGNOSIS — Z7982 Long term (current) use of aspirin: Secondary | ICD-10-CM | POA: Insufficient documentation

## 2016-03-31 DIAGNOSIS — R140 Abdominal distension (gaseous): Secondary | ICD-10-CM

## 2016-03-31 HISTORY — DX: Anxiety disorder, unspecified: F41.9

## 2016-03-31 HISTORY — DX: Bipolar disorder, unspecified: F31.9

## 2016-03-31 LAB — URINALYSIS, ROUTINE W REFLEX MICROSCOPIC
Bilirubin Urine: NEGATIVE
Glucose, UA: NEGATIVE mg/dL
HGB URINE DIPSTICK: NEGATIVE
Ketones, ur: NEGATIVE mg/dL
LEUKOCYTES UA: NEGATIVE
Nitrite: NEGATIVE
PH: 7 (ref 5.0–8.0)
Protein, ur: NEGATIVE mg/dL
SPECIFIC GRAVITY, URINE: 1.009 (ref 1.005–1.030)

## 2016-03-31 LAB — COMPREHENSIVE METABOLIC PANEL
ALBUMIN: 4.7 g/dL (ref 3.5–5.0)
ALT: 17 U/L (ref 17–63)
AST: 18 U/L (ref 15–41)
Alkaline Phosphatase: 79 U/L (ref 38–126)
Anion gap: 9 (ref 5–15)
BUN: 12 mg/dL (ref 6–20)
CO2: 26 mmol/L (ref 22–32)
Calcium: 9.6 mg/dL (ref 8.9–10.3)
Chloride: 105 mmol/L (ref 101–111)
Creatinine, Ser: 0.99 mg/dL (ref 0.61–1.24)
GFR calc Af Amer: >60 mL/min (ref >60)
GLUCOSE: 91 mg/dL (ref 65–99)
POTASSIUM: 4.1 mmol/L (ref 3.5–5.1)
Sodium: 140 mmol/L (ref 135–145)
Total Bilirubin: 1 mg/dL (ref 0.3–1.2)
Total Protein: 7.1 g/dL (ref 6.5–8.1)

## 2016-03-31 LAB — CBC WITH DIFFERENTIAL/PLATELET
BASOS PCT: 0 %
Basophils Absolute: 0 10*3/uL (ref 0.0–0.1)
Eosinophils Absolute: 0.1 10*3/uL (ref 0.0–0.7)
Eosinophils Relative: 1 %
HCT: 42.3 % (ref 39.0–52.0)
Hemoglobin: 15.1 g/dL (ref 13.0–17.0)
LYMPHS PCT: 40 %
Lymphs Abs: 2.2 10*3/uL (ref 0.7–4.0)
MCH: 28.4 pg (ref 26.0–34.0)
MCHC: 35.7 g/dL (ref 30.0–36.0)
MCV: 79.7 fL (ref 78.0–100.0)
Monocytes Absolute: 0.7 10*3/uL (ref 0.1–1.0)
Monocytes Relative: 12 %
NEUTROS ABS: 2.5 10*3/uL (ref 1.7–7.7)
Neutrophils Relative %: 47 %
PLATELETS: 278 10*3/uL (ref 150–400)
RBC: 5.31 MIL/uL (ref 4.22–5.81)
RDW: 12.4 % (ref 11.5–15.5)
WBC: 5.5 10*3/uL (ref 4.0–10.5)

## 2016-03-31 LAB — LIPASE, BLOOD: LIPASE: 42 U/L (ref 11–51)

## 2016-03-31 LAB — POC OCCULT BLOOD, ED: Fecal Occult Bld: NEGATIVE

## 2016-03-31 MED ORDER — RANITIDINE HCL 150 MG PO TABS: 150.0000 mg | ORAL_TABLET | Freq: Two times a day (BID) | ORAL | 0 refills | Status: DC

## 2016-03-31 MED ORDER — RANITIDINE HCL 150 MG/10ML PO SYRP
150.0000 mg | ORAL_SOLUTION | Freq: Once | ORAL | Status: AC
  Administered 2016-03-31: 150 mg via ORAL
  Filled 2016-03-31: qty 10

## 2016-03-31 MED ORDER — PROMETHAZINE HCL 50 MG PO TABS: 50.0000 mg | ORAL_TABLET | Freq: Four times a day (QID) | ORAL | 0 refills | Status: DC | PRN

## 2016-03-31 MED ORDER — ONDANSETRON HCL 4 MG/2ML IJ SOLN
4.0000 mg | Freq: Once | INTRAMUSCULAR | Status: AC
  Administered 2016-03-31: 4 mg via INTRAVENOUS
  Filled 2016-03-31: qty 2

## 2016-03-31 MED ORDER — ALUM & MAG HYDROXIDE-SIMETH 200-200-20 MG/5ML PO SUSP: 15.0000 mL | Freq: Once | ORAL | Status: DC

## 2016-03-31 MED ORDER — OMEPRAZOLE 40 MG PO CPDR: 40.0000 mg | DELAYED_RELEASE_CAPSULE | Freq: Every day | ORAL | 0 refills | Status: DC

## 2016-03-31 MED ORDER — ACETAMINOPHEN 500 MG PO TABS
1000.0000 mg | ORAL_TABLET | Freq: Once | ORAL | Status: DC
  Filled 2016-03-31: qty 2

## 2016-03-31 NOTE — ED Triage Notes (Signed)
Pt reports 5 week hx of nausea, vomiting and bloating. Pt stated that he vomits daily. Feels bloated at all times. Weight loss of 8 lbs over last 2 months. Pt had small breakfast today., did not vomit food. Pt was driven to ED by mother. Pt. Denies having a PCP. Pt is alert, oriented and cooperative

## 2016-03-31 NOTE — ED Notes (Signed)
Pt given gingerale and crackers 

## 2016-03-31 NOTE — Discharge Instructions (Signed)
Your emergency department workup was all normal today. I doubt that your symptoms are caused by an intra-abdominal emergency. We will treat you symptoms symptomatically but it is important that he follow-up with a GI specialist for further discussion of her symptoms and consideration of further workup as an outpatient.  You have been prescribed Zantac and Prilosec for acid reduction in her stomach, this may provide relief to some of her symptoms. You have also been prescribed Phenergan for nausea, please take this medication in the mornings as soon as you wake up to prevent vomiting.  Please read the attached information on gastroesophageal reflux disease and nausea and vomiting. Please schedule an appointment with a GI specialist as well as able to.  Return to the emergency department if her symptoms worsen.

## 2016-03-31 NOTE — ED Provider Notes (Signed)
[facility]-EMERGENCY DEPT Provider Note   CSN: [id number] Arrival date & time: 03/31/16  1239     History   Chief Complaint Chief Complaint  Patient presents with  . Nausea  . Emesis  . Bloated  . Migraine    HPI Kenneth Crawford is a 31 y.o. male with past medical history of anxiety and bipolar disorder presents to the emergency department endorsing morning nausea and vomiting (up to 3 times daily), gas discomfort in upper abdomen to chest, bloating, decreased appetite, and weight loss (8 pounds) for 5 weeks. Patient also reports painless BRB in bowel movements and recent changes in stool form in appearance over the last 4-5 months. Patient states that every morning he wakes up and vomits up to 3 times within 1 hour of waking up. He then does not have an appetite and doesn't eat until around 4-5 PM. When he finally eats he is only able to takes 2-3 bites of his food and feels full and bloated so he stops eating. Patient initially thought that morning nausea and vomiting was due to his anxiety however he states that his anxiety is mostly at baseline.    Patient denies fevers, abdominal pain, diarrhea, new and recent medications, heavy use of ibuprofen, heavy alcohol consumption or previous history of ulcers. Patient does not smoke cigarettes. Patient tried smoking marijuana for the nausea which she states did help. No previous history of IBS, IBD or GERD. Pt has not tried anything for his pain.  Pt states he came in to ED today after 5 weeks because he threw up 3 times at work this morning and when he stood up he felt really weak.   HPI  Past Medical History:  Diagnosis Date  . Anxiety   . Bipolar 1 disorder (HCC)   . H1N1 influenza    2009    Patient Active Problem List   Diagnosis Date Noted  . Delusional disorder (HCC)   . Bipolar disorder, current episode depressed, severe, with psychotic features [facility](HCC)     Past Surgical History:  Procedure Laterality Date  . ADENOIDECTOMY    .  WISDOM TOOTH EXTRACTION         Home Medications    Prior to Admission medications   Medication Sig Start Date End Date Taking? Authorizing Provider  ALPRAZolam Prudy Feeler(XANAX) 0.5 MG tablet Take 0.5-1 mg by mouth 2 (two) times daily as needed for anxiety.   Yes Historical Provider, MD  aspirin-acetaminophen-caffeine (EXCEDRIN MIGRAINE) [phone] MG tablet Take 2 tablets by mouth every 6 (six) hours as needed for headache.   Yes Historical Provider, MD  lamoTRIgine (LAMICTAL) 200 MG tablet Take 200 mg by mouth daily.   Yes Historical Provider, MD  omeprazole (PRILOSEC) 40 MG capsule Take 1 capsule (40 mg total) by mouth daily. 03/31/16 04/30/16  Liberty Handylaudia J Nicholaos Schippers, PA-C  promethazine (PHENERGAN) 50 MG tablet Take 1 tablet (50 mg total) by mouth every 6 (six) hours as needed for nausea or vomiting. 03/31/16   Liberty Handylaudia J Moustapha Tooker, PA-C  ranitidine (ZANTAC) 150 MG tablet Take 1 tablet (150 mg total) by mouth 2 (two) times daily. 03/31/16 04/30/16  Liberty Handylaudia J Kealani Leckey, PA-C  risperiDONE (RISPERDAL) 0.5 MG tablet Take 1 tablet (0.5 mg total) by mouth 2 (two) times daily. Patient not taking: Reported on 03/31/2016 03/02/14   Earney NavyJosephine C Onuoha, NP  traMADol (ULTRAM) 50 MG tablet Take 1 tablet (50 mg total) by mouth every 6 (six) hours as needed. Patient not taking: Reported on 03/01/2014 03/09/13  Marlon Pel, PA-C    Family History Family History  Problem Relation Age of Onset  . Hypertension Father     Social History Social History  Substance Use Topics  . Smoking status: Never Smoker  . Smokeless tobacco: Never Used  . Alcohol use Yes     Comment: occ     Allergies   Other; Seroquel [quetiapine fumarate]; Sulfa antibiotics; and Sertraline   Review of Systems Review of Systems  Constitutional: Positive for appetite change and unexpected weight change. Negative for chills, fatigue and fever.  HENT: Negative for congestion and sore throat.   Eyes: Negative for visual disturbance.    Respiratory: Negative for cough, choking and shortness of breath.   Cardiovascular: Negative for chest pain and palpitations.  Gastrointestinal: Positive for blood in stool, nausea and vomiting. Negative for abdominal pain, constipation, diarrhea and rectal pain.  Genitourinary: Negative for difficulty urinating, flank pain and hematuria.  Musculoskeletal: Negative for joint swelling and myalgias.  Skin: Negative for rash.  Neurological: Negative for dizziness, syncope, weakness, light-headedness and headaches.  Hematological: Negative.   Psychiatric/Behavioral: The patient is nervous/anxious.      Physical Exam Updated Vital Signs BP 123/85   Pulse 65   Temp 97.8 F (36.6 C) (Oral)   Resp 18   Ht 5\' 11"  (1.803 m)   Wt 78.5 kg   SpO2 100%   BMI 24.13 kg/m   Physical Exam  Constitutional: He is oriented to person, place, and time. He appears well-developed and well-nourished. No distress.  HENT:  Head: Normocephalic and atraumatic.  Nose: Nose normal.  Mouth/Throat: Oropharynx is clear and moist. No oropharyngeal exudate.  Moist mucous membranes  Eyes: Conjunctivae and EOM are normal. Pupils are equal, round, and reactive to light.  Neck: Normal range of motion. Neck supple. No JVD present. No tracheal deviation present.  Cardiovascular: Normal rate, regular rhythm, normal heart sounds and intact distal pulses.   No murmur heard. Pulmonary/Chest: Effort normal and breath sounds normal. No respiratory distress. He has no wheezes. He has no rales.  Abdominal: Soft. Bowel sounds are normal. He exhibits no distension. There is no tenderness.  No epigastric tenderness. No abdominal tenderness, negative Murphy's and McBurney's.  Bowel sounds normal. No distention, guarding, mass, rebound or hernias noted.   Genitourinary:  Genitourinary Comments: No external hemorrhoids or fissures noted. No internal hemorrhoids noted.  Good rectal tone.  Light brown stool on finger without  obvious blood.  Musculoskeletal: Normal range of motion. He exhibits no deformity.  Lymphadenopathy:    He has no cervical adenopathy.  Neurological: He is alert and oriented to person, place, and time.  Skin: Skin is warm and dry. Capillary refill takes less than 2 seconds. No rash noted.  Psychiatric: He has a normal mood and affect. His behavior is normal. Judgment and thought content normal.  Nursing note and vitals reviewed.    ED Treatments / Results  Labs (all labs ordered are listed, but only abnormal results are displayed) Labs Reviewed  CBC WITH DIFFERENTIAL/PLATELET  COMPREHENSIVE METABOLIC PANEL  LIPASE, BLOOD  URINALYSIS, ROUTINE W REFLEX MICROSCOPIC  POC OCCULT BLOOD, ED    EKG  EKG Interpretation None       Radiology No results found.  Procedures Procedures (including critical care time)  Medications Ordered in ED Medications  alum & mag hydroxide-simeth (MAALOX/MYLANTA) 200-200-20 MG/5ML suspension 15 mL (not administered)  ranitidine (ZANTAC) 150 MG/10ML syrup 150 mg (150 mg Oral Given 03/31/16 1711)  ondansetron (ZOFRAN) injection  4 mg (4 mg Intravenous Given 03/31/16 1711)     Initial Impression / Assessment and Plan / ED Course  I have reviewed the triage vital signs and the nursing notes.  Pertinent labs & imaging results that were available during my care of the patient were reviewed by me and considered in my medical decision making (see chart for details).  Clinical Course as of Mar 31 1818  Sat Mar 31, 2016  1806 Repeat abdominal exam unchanged from initial. Pt tolerated fluids, crackers and pb without nausea or abdominal pain.   [CG]    Clinical Course User Index [CG] Liberty Handy, PA-C   Initial differential diagnosis includes GERD with alarm symptoms (weight loss, daily nausea and vomiting), gastroparesis and less likely gastritis or esophagitis.  Hemoccult, U/A, CMP, lipase, CBC all wnl.  Pt tolerated fluids, crackers and peanut  butter in ED without exacerbation of symptoms. Pt given zofran, zantac suspension and maalox in ED. At this time pt appears to be stable, VS are wnl and pt has ambulated and tolerated PO without difficulty.  I doubt his symptoms are caused by an intraabdominal emergency.  Pt will be discharged with ranitidine, omeprazole, mylanta and phenergan for symptoms with GI f/u as soon as possibly. Pt given strict ED return instructions.  Pt verbalized understanding and is agreeable to discharge plan.   Pt discussed with Dr. Criss Alvine who agrees with ED tx and discharge plan.   Final Clinical Impressions(s) / ED Diagnoses   Final diagnoses:  Intractable vomiting with nausea, unspecified vomiting type  Bloating  Dyspepsia    New Prescriptions New Prescriptions   OMEPRAZOLE (PRILOSEC) 40 MG CAPSULE    Take 1 capsule (40 mg total) by mouth daily.   PROMETHAZINE (PHENERGAN) 50 MG TABLET    Take 1 tablet (50 mg total) by mouth every 6 (six) hours as needed for nausea or vomiting.   RANITIDINE (ZANTAC) 150 MG TABLET    Take 1 tablet (150 mg total) by mouth 2 (two) times daily.     Liberty Handy, PA-C 03/31/16 1820    Pricilla Loveless, MD 04/03/16 [phone]

## 2016-04-15 ENCOUNTER — Encounter (HOSPITAL_COMMUNITY): Payer: Self-pay | Admitting: Oncology

## 2016-04-15 ENCOUNTER — Emergency Department (HOSPITAL_COMMUNITY)
Admission: EM | Admit: 2016-04-15 | Discharge: 2016-04-15 | Disposition: A | Discharge status: Home / Self Care | Payer: BLUE CROSS/BLUE SHIELD | Attending: Emergency Medicine | Admitting: Emergency Medicine

## 2016-04-15 ENCOUNTER — Emergency Department (HOSPITAL_COMMUNITY): Payer: BLUE CROSS/BLUE SHIELD

## 2016-04-15 DIAGNOSIS — R197 Diarrhea, unspecified: Secondary | ICD-10-CM | POA: Diagnosis not present

## 2016-04-15 DIAGNOSIS — R112 Nausea with vomiting, unspecified: Secondary | ICD-10-CM

## 2016-04-15 DIAGNOSIS — R1030 Lower abdominal pain, unspecified: Secondary | ICD-10-CM | POA: Diagnosis present

## 2016-04-15 LAB — COMPREHENSIVE METABOLIC PANEL
ALT: 13 U/L — ABNORMAL LOW (ref 17–63)
ANION GAP: 6 (ref 5–15)
AST: 19 U/L (ref 15–41)
Albumin: 4.7 g/dL (ref 3.5–5.0)
Alkaline Phosphatase: 71 U/L (ref 38–126)
BUN: 12 mg/dL (ref 6–20)
CO2: 28 mmol/L (ref 22–32)
Calcium: 9.3 mg/dL (ref 8.9–10.3)
Chloride: 106 mmol/L (ref 101–111)
Creatinine, Ser: 0.94 mg/dL (ref 0.61–1.24)
GFR calc Af Amer: >60 mL/min (ref >60)
GFR calc non Af Amer: >60 mL/min (ref >60)
GLUCOSE: 99 mg/dL (ref 65–99)
Potassium: 3.5 mmol/L (ref 3.5–5.1)
SODIUM: 140 mmol/L (ref 135–145)
Total Bilirubin: 0.8 mg/dL (ref 0.3–1.2)
Total Protein: 6.8 g/dL (ref 6.5–8.1)

## 2016-04-15 LAB — URINALYSIS, ROUTINE W REFLEX MICROSCOPIC
BILIRUBIN URINE: NEGATIVE
Glucose, UA: NEGATIVE mg/dL
Hgb urine dipstick: NEGATIVE
Ketones, ur: NEGATIVE mg/dL
Leukocytes, UA: NEGATIVE
Nitrite: NEGATIVE
Protein, ur: NEGATIVE mg/dL
Specific Gravity, Urine: 1.017 (ref 1.005–1.030)
pH: 8 (ref 5.0–8.0)

## 2016-04-15 LAB — CBC
HCT: 42.4 % (ref 39.0–52.0)
Hemoglobin: 14.8 g/dL (ref 13.0–17.0)
MCH: 28.4 pg (ref 26.0–34.0)
MCHC: 34.9 g/dL (ref 30.0–36.0)
MCV: 81.2 fL (ref 78.0–100.0)
Platelets: 279 10*3/uL (ref 150–400)
RBC: 5.22 MIL/uL (ref 4.22–5.81)
RDW: 13.1 % (ref 11.5–15.5)
WBC: 6.5 10*3/uL (ref 4.0–10.5)

## 2016-04-15 LAB — LIPASE, BLOOD: Lipase: 60 U/L — ABNORMAL HIGH (ref 11–51)

## 2016-04-15 MED ORDER — DICYCLOMINE HCL 20 MG PO TABS: 20.0000 mg | ORAL_TABLET | Freq: Two times a day (BID) | ORAL | 0 refills | Status: DC

## 2016-04-15 MED ORDER — SODIUM CHLORIDE 0.9 % IV BOLUS (SEPSIS)
1000.0000 mL | Freq: Once | INTRAVENOUS | Status: AC
  Administered 2016-04-15: 1000 mL via INTRAVENOUS

## 2016-04-15 MED ORDER — IOPAMIDOL (ISOVUE-300) INJECTION 61%
100.0000 mL | Freq: Once | INTRAVENOUS | Status: AC | PRN
  Administered 2016-04-15: 100 mL via INTRAVENOUS

## 2016-04-15 MED ORDER — IOPAMIDOL (ISOVUE-300) INJECTION 61%
INTRAVENOUS | Status: AC
  Filled 2016-04-15: qty 100

## 2016-04-15 MED ORDER — LOPERAMIDE HCL 2 MG PO CAPS: 2.0000 mg | ORAL_CAPSULE | Freq: Four times a day (QID) | ORAL | 0 refills | Status: DC | PRN

## 2016-04-15 NOTE — ED Provider Notes (Signed)
Emergency Department Provider Note   I have reviewed the triage vital signs and the nursing notes.   HISTORY  Chief Complaint Abdominal Pain   HPI Kenneth Crawford is a 31 y.o. male with PMH of anxiety and bipolar disorder presents to the emergency department for evaluation of persistent nausea, vomiting, weight loss with new onset lower abdominal pain and diarrhea. Patient has had approximately 7 weeks of weight loss, nausea, vomiting. Yesterday developed some bilateral lower abdominal pain that is intermittent and feels like tightness or pulling. No exacerbating or alleviating factors. No history of surgery on the abdomen. No fevers or shaking chills. Denies dysuria. Patient was seen in the emergency department earlier this month for nausea and vomiting is been taking his medications with mild relief in symptoms. No chest pain or difficulty breathing. No sick contacts. Pain does not radiate.    Past Medical History:  Diagnosis Date  . Anxiety   . Bipolar 1 disorder (HCC)   . H1N1 influenza    2009    Patient Active Problem List   Diagnosis Date Noted  . Delusional disorder (HCC)   . Bipolar disorder, current episode depressed, severe, with psychotic features [facility](HCC)     Past Surgical History:  Procedure Laterality Date  . ADENOIDECTOMY    . WISDOM TOOTH EXTRACTION      Current Outpatient Rx  . Order #: [id number] Class: Historical Med  . Order #: [id number] Class: Historical Med  . Order #: [id number] Class: Print  . Order #: [id number] Class: Print  . Order #: [id number] Class: Print  . Order #: [id number] Class: Print  . Order #: [id number] Class: Print  . Order #: [id number] Class: Print  . Order #: [id number] Class: Print    Allergies Other; Seroquel [quetiapine fumarate]; Sulfa antibiotics; and Sertraline  Family History  Problem Relation Age of Onset  . Hypertension Father     Social History Social History  Substance Use Topics  . Smoking status: Never Smoker  .  Smokeless tobacco: Never Used  . Alcohol use Yes     Comment: occ    Review of Systems  Constitutional: No fever/chills. Positive weight loss.  Eyes: No visual changes. ENT: No sore throat. Cardiovascular: Denies chest pain. Respiratory: Denies shortness of breath. Gastrointestinal: Positive lower abdominal pain. Positive nausea, vomiting, and diarrhea.  No constipation. Genitourinary: Negative for dysuria. Musculoskeletal: Negative for back pain. Skin: Negative for rash. Neurological: Negative for headaches, focal weakness or numbness.  10-point ROS otherwise negative.  ____________________________________________   PHYSICAL EXAM:  VITAL SIGNS: ED Triage Vitals  Enc Vitals Group     BP 04/15/16 0444 137/90     Pulse Rate 04/15/16 0444 82     Resp 04/15/16 0444 16     Temp 04/15/16 0444 97.8 F (36.6 C)     Temp Source 04/15/16 0444 Oral     SpO2 04/15/16 0444 100 %     Weight 04/15/16 0518 158 lb (71.7 kg)     Height 04/15/16 0518 5\' 11"  (1.803 m)     Pain Score 04/15/16 0447 6   Constitutional: Alert and oriented. Well appearing and in no acute distress. Eyes: Conjunctivae are normal.  Head: Atraumatic. Nose: No congestion/rhinnorhea. Mouth/Throat: Mucous membranes are moist.  Oropharynx non-erythematous. Neck: No stridor.  Cardiovascular: Normal rate, regular rhythm. Good peripheral circulation. Grossly normal heart sounds.   Respiratory: Normal respiratory effort.  No retractions. Lungs CTAB. Gastrointestinal: Soft with mild lower abdominal tenderness. No distention.  Musculoskeletal: No lower extremity tenderness nor edema. No gross deformities  of extremities. Neurologic:  Normal speech and language. No gross focal neurologic deficits are appreciated.  Skin:  Skin is warm, dry and intact. No rash noted. Psychiatric: Mood and affect are normal. Speech and behavior are normal.  ____________________________________________   LABS (all labs ordered are listed,  but only abnormal results are displayed)  Labs Reviewed  LIPASE, BLOOD - Abnormal; Notable for the following:       Result Value   Lipase 60 (*)    All other components within normal limits  COMPREHENSIVE METABOLIC PANEL - Abnormal; Notable for the following:    ALT 13 (*)    All other components within normal limits  URINALYSIS, ROUTINE W REFLEX MICROSCOPIC - Abnormal; Notable for the following:    APPearance CLOUDY (*)    All other components within normal limits  CBC   ____________________________________________  RADIOLOGY  Ct Abdomen Pelvis W Contrast  Result Date: 04/15/2016 CLINICAL DATA:  Lower abdominal pain EXAM: CT ABDOMEN AND PELVIS WITH CONTRAST TECHNIQUE: Multidetector CT imaging of the abdomen and pelvis was performed using the standard protocol following bolus administration of intravenous contrast. CONTRAST:  ISOVUE-300 IOPAMIDOL (ISOVUE-300) INJECTION 61% COMPARISON:  None. FINDINGS: Lower chest: Lung bases are clear. No effusions. Heart is normal size. Hepatobiliary: No focal hepatic abnormality. Gallbladder unremarkable. Pancreas: No focal abnormality or ductal dilatation. Spleen: No focal abnormality.  Normal size. Adrenals/Urinary Tract: No adrenal abnormality. No focal renal abnormality. No stones or hydronephrosis. Urinary bladder is unremarkable. Stomach/Bowel: Appendix is normal. Stomach, large and small bowel grossly unremarkable. Vascular/Lymphatic: No evidence of aneurysm or adenopathy. Reproductive: No visible focal abnormality. Other: Insert other Musculoskeletal: No acute bony abnormality. IMPRESSION: No acute findings in the abdomen or pelvis. Electronically Signed   By: Charlett Nose M.D.   On: 04/15/2016 09:00    ____________________________________________   PROCEDURES  Procedure(s) performed:   Procedures  None ____________________________________________   INITIAL IMPRESSION / ASSESSMENT AND PLAN / ED COURSE  Pertinent labs & imaging  results that were available during my care of the patient were reviewed by me and considered in my medical decision making (see chart for details).  Patient resents to the emergent department for evaluation of persistent nausea, vomiting, weight loss acute onset lower abdominal pain and diarrhea. Patient has mild left side and left lower abdominal tenderness to palpation. No rebound or guarding. With persistent symptoms, weight loss, new onset abdominal pain plan for CT of the abdomen and pelvis for better characterization. No pain or nausea this time. We'll give IV fluids.  09:37 AM Patient continues to feel well. Urine normal. Normal labs. CT normal. Plan for discharge with primary care physician follow-up and gastroenterology referral as needed.   At this time, I do not feel there is any life-threatening condition present. I have reviewed and discussed all results (EKG, imaging, lab, urine as appropriate), exam findings with patient. I have reviewed nursing notes and appropriate previous records.  I feel the patient is safe to be discharged home without further emergent workup. Discussed usual and customary return precautions. Patient and family (if present) verbalize understanding and are comfortable with this plan.  Patient will follow-up with their primary care provider. If they do not have a primary care provider, information for follow-up has been provided to them. All questions have been answered.  ____________________________________________  FINAL CLINICAL IMPRESSION(S) / ED DIAGNOSES  Final diagnoses:  Lower abdominal pain  Nausea vomiting and diarrhea     MEDICATIONS GIVEN DURING THIS VISIT:  Medications  sodium chloride 0.9 % bolus 1,000 mL (0 mLs Intravenous Stopped 04/15/16 0902)  iopamidol (ISOVUE-300) 61 % injection 100 mL (100 mLs Intravenous Contrast Given 04/15/16 0846)     NEW OUTPATIENT MEDICATIONS STARTED DURING THIS VISIT:  Discharge Medication List as of  04/15/2016  9:39 AM    START taking these medications   Details  dicyclomine (BENTYL) 20 MG tablet Take 1 tablet (20 mg total) by mouth 2 (two) times daily., Starting Sun 04/15/2016, Print    loperamide (IMODIUM) 2 MG capsule Take 1 capsule (2 mg total) by mouth 4 (four) times daily as needed for diarrhea or loose stools., Starting Sun 04/15/2016, Print          Note:  This document was prepared using Dragon voice recognition software and may include unintentional dictation errors.  Alona Bene, MD Emergency Medicine   Maia Plan, MD 04/15/16 [phone]

## 2016-04-15 NOTE — ED Triage Notes (Signed)
Pt c/o lower abdominal pain that started yesterday.  Rates pain 6/10.  Pt also c/o N/V/D x 2 months.  Was evaluated for this here 2 weeks ago.

## 2016-04-15 NOTE — ED Notes (Signed)
Patient discharge home with family

## 2016-04-15 NOTE — Discharge Instructions (Signed)

## 2016-09-01 ENCOUNTER — Encounter (HOSPITAL_COMMUNITY): Payer: Self-pay | Admitting: Nurse Practitioner

## 2016-09-01 DIAGNOSIS — Z23 Encounter for immunization: Secondary | ICD-10-CM | POA: Insufficient documentation

## 2016-09-01 DIAGNOSIS — Y929 Unspecified place or not applicable: Secondary | ICD-10-CM | POA: Diagnosis not present

## 2016-09-01 DIAGNOSIS — S61011A Laceration without foreign body of right thumb without damage to nail, initial encounter: Secondary | ICD-10-CM | POA: Diagnosis not present

## 2016-09-01 DIAGNOSIS — Z7982 Long term (current) use of aspirin: Secondary | ICD-10-CM | POA: Insufficient documentation

## 2016-09-01 DIAGNOSIS — W260XXA Contact with knife, initial encounter: Secondary | ICD-10-CM | POA: Diagnosis not present

## 2016-09-01 DIAGNOSIS — Y999 Unspecified external cause status: Secondary | ICD-10-CM | POA: Diagnosis not present

## 2016-09-01 DIAGNOSIS — Y93G3 Activity, cooking and baking: Secondary | ICD-10-CM | POA: Insufficient documentation

## 2016-09-01 DIAGNOSIS — Z79899 Other long term (current) drug therapy: Secondary | ICD-10-CM | POA: Diagnosis not present

## 2016-09-01 NOTE — ED Triage Notes (Signed)
Patient states around 10pm he was cleaning his Ninja blender and wasn't paying attention. Blade cut his thumb. He cleansed with water and applied pressure dressing afterwards. Stated he waited and when he looked at it, it appeared pretty deep so he decided to come in to get checked out. Denies any numbness or tingling in thumb or hand. States pain is much better at a 1/2 with pressure dressing on it.

## 2016-09-02 ENCOUNTER — Emergency Department (HOSPITAL_COMMUNITY)
Admission: EM | Admit: 2016-09-02 | Discharge: 2016-09-02 | Disposition: A | Discharge status: Home / Self Care | Payer: BLUE CROSS/BLUE SHIELD | Attending: Emergency Medicine | Admitting: Emergency Medicine

## 2016-09-02 DIAGNOSIS — S61011A Laceration without foreign body of right thumb without damage to nail, initial encounter: Secondary | ICD-10-CM

## 2016-09-02 MED ORDER — IBUPROFEN 600 MG PO TABS: 600.0000 mg | ORAL_TABLET | Freq: Four times a day (QID) | ORAL | 0 refills | Status: DC | PRN

## 2016-09-02 MED ORDER — LIDOCAINE HCL (PF) 1 % IJ SOLN
10.0000 mL | Freq: Once | INTRAMUSCULAR | Status: DC
Start: 2016-09-02 — End: 2016-09-02

## 2016-09-02 MED ORDER — LIDOCAINE HCL 1 % IJ SOLN
INTRAMUSCULAR | Status: AC
  Administered 2016-09-02: 06:00:00
  Filled 2016-09-02: qty 20

## 2016-09-02 MED ORDER — TETANUS-DIPHTH-ACELL PERTUSSIS 5-2.5-18.5 LF-MCG/0.5 IM SUSP
0.5000 mL | Freq: Once | INTRAMUSCULAR | Status: AC
  Administered 2016-09-02: 0.5 mL via INTRAMUSCULAR
  Filled 2016-09-02: qty 0.5

## 2016-09-02 MED ORDER — BACITRACIN ZINC 500 UNIT/GM EX OINT
TOPICAL_OINTMENT | CUTANEOUS | Status: AC
  Administered 2016-09-02: 06:00:00
  Filled 2016-09-02: qty 0.9

## 2016-09-02 NOTE — ED Provider Notes (Signed)
[facility]-EMERGENCY DEPT Provider Note   CSN: [id number] Arrival date & time: 09/01/16  2301     History   Chief Complaint Chief Complaint  Patient presents with  . Extremity Laceration    Right Thumb     HPI Kenneth Crawford is a 31 y.o. male.  Patient presents with right thumb laceration caused by knife while cooking. No other injury. He denies loss of function or sensation. Tetanus is not up to date.   The history is provided by the patient. No language interpreter was used.    Past Medical History:  Diagnosis Date  . Anxiety   . Bipolar 1 disorder (HCC)   . H1N1 influenza    2009    Patient Active Problem List   Diagnosis Date Noted  . Delusional disorder (HCC)   . Bipolar disorder, current episode depressed, severe, with psychotic features [facility])     Past Surgical History:  Procedure Laterality Date  . ADENOIDECTOMY    . WISDOM TOOTH EXTRACTION         Home Medications    Prior to Admission medications   Medication Sig Start Date End Date Taking? Authorizing Provider  aspirin-acetaminophen-caffeine (EXCEDRIN MIGRAINE) [phone] MG tablet Take 2 tablets by mouth every 6 (six) hours as needed for headache.    [provider]  dicyclomine (BENTYL) 20 MG tablet Take 1 tablet (20 mg total) by mouth 2 (two) times daily. 04/15/16   Long, Arlyss Repress, MD  lamoTRIgine (LAMICTAL) 200 MG tablet Take 200 mg by mouth daily.    [provider]  loperamide (IMODIUM) 2 MG capsule Take 1 capsule (2 mg total) by mouth 4 (four) times daily as needed for diarrhea or loose stools. 04/15/16   Long, Arlyss Repress, MD  omeprazole (PRILOSEC) 40 MG capsule Take 1 capsule (40 mg total) by mouth daily. 03/31/16 04/30/16  Liberty Handy, PA-C  promethazine (PHENERGAN) 50 MG tablet Take 1 tablet (50 mg total) by mouth every 6 (six) hours as needed for nausea or vomiting. 03/31/16   Liberty Handy, PA-C  ranitidine (ZANTAC) 150 MG tablet Take 1 tablet (150 mg total) by mouth 2  (two) times daily. 03/31/16 04/30/16  Liberty Handy, PA-C  risperiDONE (RISPERDAL) 0.5 MG tablet Take 1 tablet (0.5 mg total) by mouth 2 (two) times daily. Patient not taking: Reported on 03/31/2016 03/02/14   Earney Navy, NP  traMADol (ULTRAM) 50 MG tablet Take 1 tablet (50 mg total) by mouth every 6 (six) hours as needed. Patient not taking: Reported on 03/01/2014 03/09/13   Marlon Pel, PA-C    Family History Family History  Problem Relation Age of Onset  . Hypertension Father     Social History Social History  Substance Use Topics  . Smoking status: Never Smoker  . Smokeless tobacco: Never Used  . Alcohol use Yes     Comment: occ     Allergies   Other; Seroquel [quetiapine fumarate]; Sulfa antibiotics; and Sertraline   Review of Systems Review of Systems  Skin: Positive for wound.  Neurological: Negative for weakness and numbness.     Physical Exam Updated Vital Signs BP 105/69   Pulse (!) 46   Temp 98.7 F (37.1 C) (Oral)   Resp 16   Ht 5\' 10"  (1.778 m)   Wt 70.3 kg (155 lb)   SpO2 98%   BMI 22.24 kg/m   Physical Exam  Constitutional: He appears well-developed and well-nourished. No distress.  Musculoskeletal:  No tendon deficit  of right thumb.   Neurological: No sensory deficit.  Skin:  3 cm laceration palmar distal right thumb.      ED Treatments / Results  Labs (all labs ordered are listed, but only abnormal results are displayed) Labs Reviewed - No data to display  EKG  EKG Interpretation None       Radiology No results found.  Procedures Procedures (including critical care time) LACERATION REPAIR Performed by: Elpidio AnisUPSTILL, Aamiyah Derrick A Authorized by: Elpidio AnisUPSTILL, Devon Kingdon A Consent: Verbal consent obtained. Risks and benefits: risks, benefits and alternatives were discussed Consent given by: patient Patient identity confirmed: provided demographic data Prepped and Draped in normal sterile fashion Wound explored  Laceration  Location: right palmar thumb  Laceration Length: 3 cm  No Foreign Bodies seen or palpated  Anesthesia: local infiltration  Local anesthetic: lidocaine  1% w/o epinephrine  Anesthetic total: 2 ml  Irrigation method: syringe Amount of cleaning: standard  Skin closure: 4-0 prolene  Number of sutures: 6  Technique: simple interrupted  Patient tolerance: Patient tolerated the procedure well with no immediate complications.  Medications Ordered in ED Medications  lidocaine (PF) (XYLOCAINE) 1 % injection 10 mL (10 mLs Infiltration Not Given 09/02/16 0536)  Tdap (BOOSTRIX) injection 0.5 mL (0.5 mLs Intramuscular Given 09/02/16 0339)  lidocaine (XYLOCAINE) 1 % (with pres) injection (  Given 09/02/16 0535)     Initial Impression / Assessment and Plan / ED Course  I have reviewed the triage vital signs and the nursing notes.  Pertinent labs & imaging results that were available during my care of the patient were reviewed by me and considered in my medical decision making (see chart for details).     Patient with uncomplicated laceration right thumb, repaired as per above note.   Final Clinical Impressions(s) / ED Diagnoses   Final diagnoses:  None   1. Right thumb laceration  New Prescriptions New Prescriptions   No medications on file     Elpidio AnisUpstill, Shaurya Rawdon, Cordelia Poche-C 09/02/16 0547    Melene PlanFloyd, Dan, DO 09/02/16 [phone]

## 2016-09-02 NOTE — Discharge Instructions (Signed)
Follow up with your doctor for suture removal in 7-10 days. Go sooner for recheck with any wound drainage, redness, increasing pain or fever.

## 2016-09-17 ENCOUNTER — Ambulatory Visit (INDEPENDENT_AMBULATORY_CARE_PROVIDER_SITE_OTHER): Payer: BLUE CROSS/BLUE SHIELD | Admitting: Physician Assistant

## 2016-09-17 ENCOUNTER — Encounter: Payer: Self-pay | Admitting: Physician Assistant

## 2016-09-17 VITALS — BP 120/84 | HR 86 | Temp 98.6°F | Resp 16 | Ht 71.0 in | Wt 153.0 lb

## 2016-09-17 DIAGNOSIS — Z0000 Encounter for general adult medical examination without abnormal findings: Secondary | ICD-10-CM | POA: Diagnosis not present

## 2016-09-17 DIAGNOSIS — Z599 Problem related to housing and economic circumstances, unspecified: Secondary | ICD-10-CM | POA: Diagnosis not present

## 2016-09-17 DIAGNOSIS — Z4802 Encounter for removal of sutures: Secondary | ICD-10-CM | POA: Diagnosis not present

## 2016-09-17 DIAGNOSIS — Z754 Unavailability and inaccessibility of other helping agencies: Secondary | ICD-10-CM

## 2016-09-17 DIAGNOSIS — Z13220 Encounter for screening for lipoid disorders: Secondary | ICD-10-CM

## 2016-09-17 DIAGNOSIS — F315 Bipolar disorder, current episode depressed, severe, with psychotic features: Secondary | ICD-10-CM | POA: Diagnosis not present

## 2016-09-17 NOTE — Progress Notes (Signed)
Patient: Kenneth Crawford Male    DOB: [birth year]   31 y.o.   MRN: [id number] Visit Date: 09/17/2016  Today's Provider: Trinna Post, PA-C   Chief Complaint  Patient presents with  . Establish Care  . Suture / Staple Removal   Subjective:      Kenneth Crawford is a 31 y/o man presenting today to establish care. He has not seen a PCP in 5 years. He presents here with his girlfriend Kenneth Crawford.  He is currently living in [place] and going through nursing school at [employer]. He will complete his ASN in October and wants to work in [employer] or [employer]. He works as a Building control surveyor.   He is being treated for Bipolar I depression by Dr. Jeanette Crawford in [place]. He is currently on lamictal. He also takes PRN Xanax as prescribed by Dr. Jeanette Crawford. He sees a therapist as well.   He is currently in a relationship. Sexually active, not using protection, partner on birth control. Not concerned for STI.   Drinks a couple of drinks every 9 months. Socially smokes.  No hx prostate cancer. Grandfather w/ pancreatic cancer age 32. Paternal aunt with colon cancer age 18's. Father was screened, no findings.   Dad with back problems. Mother no health problems. Brother with mental health issues.  He has one son, Kenneth Crawford, age 29. Going through difficult time with ex-wife and mother of the child. They are divorced and share custody of their son, but the relationship is difficult right now as patient feels son is not getting adequate care when with mother. Trying to gain custody but having difficulty paying legal fees.  Also here for suture removal. Had right thumb laceration on 09/02/2016 after cleaning ninja blender blade. Patient was seen in the ER and had six sutures placed. Reports wound is well healing. No redness, swelling, drainage, pus. No fevers, chills.    Suture / Staple Removal  The sutures were placed 11 to 14 days ago. There has been no drainage from the wound. There is no redness present. There is no  swelling present. There is no pain present.       Allergies  Allergen Reactions  . 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  Kenneth Crawford [Quetiapine Fumarate]     "sore throat, blisters in throat, migraine, high levels of aggression"  . Sulfa Antibiotics     unknown  . Sertraline Rash     Current Outpatient Prescriptions:  .  lamoTRIgine (LAMICTAL) 200 MG tablet, Take 200 mg by mouth daily., Disp: , Rfl:  .  aspirin-acetaminophen-caffeine (EXCEDRIN MIGRAINE) 250-250-65 MG tablet, Take 2 tablets by mouth every 6 (six) hours as needed for headache., Disp: , Rfl:  .  dicyclomine (BENTYL) 20 MG tablet, Take 1 tablet (20 mg total) by mouth 2 (two) times daily. (Patient not taking: Reported on 09/17/2016), Disp: 20 tablet, Rfl: 0 .  loperamide (IMODIUM) 2 MG capsule, Take 1 capsule (2 mg total) by mouth 4 (four) times daily as needed for diarrhea or loose stools. (Patient not taking: Reported on 09/17/2016), Disp: 12 capsule, Rfl: 0 .  omeprazole (PRILOSEC) 40 MG capsule, Take 1 capsule (40 mg total) by mouth daily., Disp: 30 capsule, Rfl: 0 .  promethazine (PHENERGAN) 50 MG tablet, Take 1 tablet (50 mg total) by mouth every 6 (six) hours as needed for nausea or vomiting. (Patient not taking: Reported on 09/17/2016), Disp: 30 tablet, Rfl:  0 .  ranitidine (ZANTAC) 150 MG tablet, Take 1 tablet (150 mg total) by mouth 2 (two) times daily., Disp: 60 tablet, Rfl: 0 .  risperiDONE (RISPERDAL) 0.5 MG tablet, Take 1 tablet (0.5 mg total) by mouth 2 (two) times daily. (Patient not taking: Reported on 03/31/2016), Disp: 60 tablet, Rfl: 0 .  traMADol (ULTRAM) 50 MG tablet, Take 1 tablet (50 mg total) by mouth every 6 (six) hours as needed. (Patient not taking: Reported on 03/01/2014), Disp: 15 tablet, Rfl: 0  Review of Systems  Constitutional: Negative.   HENT: Negative.   Eyes: Negative.   Respiratory: Negative.   Cardiovascular: Negative.     Gastrointestinal: Negative.   Endocrine: Negative.   Genitourinary: Negative.   Musculoskeletal: Negative.   Skin: Positive for wound. Negative for color change, pallor and rash.  Allergic/Immunologic: Negative.   Neurological: Negative.   Hematological: Negative.   Psychiatric/Behavioral: Negative.     Social History  Substance Use Topics  . Smoking status: Never Smoker  . Smokeless tobacco: Never Used  . Alcohol use Yes     Comment: occ   Objective:   BP 120/84 (BP Location: Right Arm, Patient Position: Sitting, Cuff Size: Normal)   Pulse 86   Temp 98.6 F (37 C) (Oral)   Resp 16   Ht 5' 11"  (1.803 m)   Wt 153 lb (69.4 kg)   BMI 21.34 kg/m  Vitals:   09/17/16 1456  BP: 120/84  Pulse: 86  Resp: 16  Temp: 98.6 F (37 C)  TempSrc: Oral  Weight: 153 lb (69.4 kg)  Height: 5' 11"  (1.803 m)     Physical Exam  Constitutional: He is oriented to person, place, and time. He appears well-developed and well-nourished. No distress.  HENT:  Right Ear: Tympanic membrane and external ear normal.  Left Ear: Tympanic membrane and external ear normal.  Mouth/Throat: Oropharynx is clear and moist. No oropharyngeal exudate.  Neck: Neck supple. No thyromegaly present.  Cardiovascular: Normal rate and regular rhythm.   Pulmonary/Chest: Effort normal and breath sounds normal.  Abdominal: Soft. Bowel sounds are normal. He exhibits no distension. There is no tenderness. There is no rebound.  Musculoskeletal: Normal range of motion.  Lymphadenopathy:    He has no cervical adenopathy.  Neurological: He is alert and oriented to person, place, and time.  Skin: Skin is dry. Laceration noted.  Well healed 2 cm linear laceration on anterior right thumb. Well approximated. No pus or drainage, redness, swelling, or bleeding.  Psychiatric: He has a normal mood and affect. His behavior is normal.   Suture Removal   09/02/2016 ER visit for laceration repair. Reviewed note in EPIC and  confirmed six sutures were placed. Right thumb laceration well healed. Laceration cleaned with iodine. Six sutures removed with sterile removal kit. Wound cleaned with alcohol swab. Patient tolerated well. No wound dehiscence, pus, drainage.       Assessment & Plan:     1. Annual physical exam  Reviewed CMET and CBC from January hospital encounter, they were normal. Labs as below.  - TSH  2. Lipid screening  - Lipid panel  3. Visit for suture removal  Tolerated well. Can use antibiotic ointment cream if concerned, and also Mederma gel for scar.  4. Bipolar disorder, current episode depressed, severe, with psychotic features ([facility])  Stable on lamictal. Seen by psychiatrist Dr. Jeanette Crawford.  Return in about 1 year (around 09/17/2017) for CPE .  The entirety of the information documented in the History  of Present Illness, Review of Systems and Physical Exam were personally obtained by me. Portions of this information were initially documented by Ashley Royalty, CMA and reviewed by me for thoroughness and accuracy.       Kenneth Post, PA-C  [facility]

## 2016-09-17 NOTE — Patient Instructions (Signed)

## 2016-09-20 NOTE — Addendum Note (Signed)
Addended by: Trey SailorsPOLLAK, ADRIANA M on: 09/20/2016 03:42 PM   Modules accepted: Orders

## 2016-12-13 ENCOUNTER — Emergency Department: Payer: BLUE CROSS/BLUE SHIELD

## 2016-12-13 ENCOUNTER — Emergency Department (INDEPENDENT_AMBULATORY_CARE_PROVIDER_SITE_OTHER)
Admission: EM | Admit: 2016-12-13 | Discharge: 2016-12-13 | Disposition: A | Discharge status: Home / Self Care | Payer: BLUE CROSS/BLUE SHIELD | Source: Home / Self Care | Attending: Family Medicine | Admitting: Family Medicine

## 2016-12-13 ENCOUNTER — Emergency Department (INDEPENDENT_AMBULATORY_CARE_PROVIDER_SITE_OTHER): Payer: BLUE CROSS/BLUE SHIELD

## 2016-12-13 ENCOUNTER — Encounter: Payer: Self-pay | Admitting: Emergency Medicine

## 2016-12-13 DIAGNOSIS — M79644 Pain in right finger(s): Secondary | ICD-10-CM

## 2016-12-13 DIAGNOSIS — S6010XA Contusion of unspecified finger with damage to nail, initial encounter: Secondary | ICD-10-CM

## 2016-12-13 DIAGNOSIS — S67190A Crushing injury of right index finger, initial encounter: Secondary | ICD-10-CM

## 2016-12-13 DIAGNOSIS — M79641 Pain in right hand: Secondary | ICD-10-CM

## 2016-12-13 MED ORDER — CEPHALEXIN 500 MG PO CAPS: 500.0000 mg | ORAL_CAPSULE | Freq: Two times a day (BID) | ORAL | 0 refills | Status: DC

## 2016-12-13 NOTE — ED Provider Notes (Signed)
[facility]    CSN: [id number] Arrival date & time: 12/13/16  1337     History   Chief Complaint Chief Complaint  Patient presents with  . Finger Injury    HPI Houston Zapien is a 31 y.o. male.   HPI Bastian Andreoli is a 31 y.o. male presenting to UC with c/o gradually worsening Right index finger pain and swelling after crushing finger in a garage door 5 days ago.  Pain is aching and sore.  Unable to fully bend finger due to pain and swelling.  He did take Excedrin the there day with moderate relief. He is Right hand dominant.    Past Medical History:  Diagnosis Date  . Anxiety   . Bipolar 1 disorder (HCC)   . H1N1 influenza    2009    Patient Active Problem List   Diagnosis Date Noted  . Delusional disorder (HCC)   . Bipolar disorder, current episode depressed, severe, with psychotic features [facility])     Past Surgical History:  Procedure Laterality Date  . ADENOIDECTOMY    . WISDOM TOOTH EXTRACTION         Home Medications    Prior to Admission medications   Medication Sig Start Date End Date Taking? Authorizing Provider  aspirin-acetaminophen-caffeine (EXCEDRIN MIGRAINE) [phone] MG tablet Take 2 tablets by mouth every 6 (six) hours as needed for headache.    [provider]  cephALEXin (KEFLEX) 500 MG capsule Take 1 capsule (500 mg total) by mouth 2 (two) times daily. 12/13/16   Lurene Shadow, PA-C  lamoTRIgine (LAMICTAL) 200 MG tablet Take 200 mg by mouth daily.    [provider]    Family History Family History  Problem Relation Age of Onset  . Hypertension Father     Social History Social History  Substance Use Topics  . Smoking status: Never Smoker  . Smokeless tobacco: Never Used  . Alcohol use Yes     Comment: occ     Allergies   Other; Seroquel [quetiapine fumarate]; Sulfa antibiotics; and Sertraline   Review of Systems Review of Systems  Musculoskeletal: Positive for arthralgias and joint  swelling.  Skin: Positive for color change. Negative for wound.  Neurological: Negative for weakness and numbness.     Physical Exam Triage Vital Signs ED Triage Vitals  Enc Vitals Group     BP 12/13/16 1437 118/77     Pulse Rate 12/13/16 1437 (!) 57     Resp --      Temp 12/13/16 1437 (!) 97.5 F (36.4 C)     Temp Source 12/13/16 1437 Oral     SpO2 12/13/16 1437 100 %     Weight 12/13/16 1439 153 lb (69.4 kg)     Height 12/13/16 1439  (1.803 m)     Head Circumference --      Peak Flow --      Pain Score 12/13/16 1439 5     Pain Loc --      Pain Edu? --      Excl. in GC? --    No data found.   Updated Vital Signs BP 118/77 (BP Location: Left Arm)   Pulse (!) 57   Temp (!) 97.5 F (36.4 C) (Oral)   Ht  (1.803 m)   Wt 153 lb (69.4 kg)   SpO2 100%   BMI 21.34 kg/m   Visual Acuity Right Eye Distance:   Left Eye Distance:   Bilateral Distance:  Right Eye Near:   Left Eye Near:    Bilateral Near:     Physical Exam  Constitutional: He is oriented to person, place, and time. He appears well-developed and well-nourished.  HENT:  Head: Normocephalic and atraumatic.  Eyes: EOM are normal.  Neck: Normal range of motion.  Cardiovascular: Normal rate.   Pulmonary/Chest: Effort normal.  Musculoskeletal: He exhibits edema and tenderness.  Right index finger, distal aspect: decreased flexion due to pain and swelling. Tenderness.  Neurological: He is alert and oriented to person, place, and time.  Skin: Skin is warm and dry. Capillary refill takes less than 2 seconds.  Right index finger: subungual hematoma. Skin in tact. No active bleeding or drainage.   Psychiatric: He has a normal mood and affect. His behavior is normal.  Nursing note and vitals reviewed.    UC Treatments / Results  Labs (all labs ordered are listed, but only abnormal results are displayed) Labs Reviewed - No data to display  EKG  EKG Interpretation None        Radiology Dg Hand Complete Right  Result Date: 12/13/2016 CLINICAL DATA:  Crush injury to the index finger EXAM: RIGHT HAND - COMPLETE 3+ VIEW COMPARISON:  None. FINDINGS: No fracture or malalignment. Soft tissue swelling at the distal aspect of the second digit. No radiopaque foreign body. IMPRESSION: Soft tissue swelling distal second digit. No acute osseous abnormality. Electronically Signed   By: Jasmine Pang M.D.   On: 12/13/2016 14:52    Procedures .Marland KitchenIncision and Drainage Date/Time: 12/13/2016 5:39 PM Performed by: Lurene Shadow Authorized by: Donna Christen A   Consent:    Consent obtained:  Verbal   Consent given by:  Patient   Risks discussed:  Incomplete drainage, pain, infection and bleeding   Alternatives discussed:  Delayed treatment and no treatment Location:    Type:  Subungual hematoma   Size:  1cm   Location:  Upper extremity   Upper extremity location:  Finger   Finger location:  R index finger Pre-procedure details:    Skin preparation:  Betadine Anesthesia (see MAR for exact dosages):    Anesthesia method:  None Procedure type:    Complexity:  Simple Procedure details:    Incision types:  Stab incision (with cautery pen)   Drainage:  Bloody   Drainage amount:  Moderate   Wound treatment: bacitracin and pressure bandage applied.   Packing materials:  None Post-procedure details:    Patient tolerance of procedure:  Tolerated well, no immediate complications   (including critical care time)  Medications Ordered in UC Medications - No data to display   Initial Impression / Assessment and Plan / UC Course  I have reviewed the triage vital signs and the nursing notes.  Pertinent labs & imaging results that were available during my care of the patient were reviewed by me and considered in my medical decision making (see chart for details).     Subungual hematoma of Right index finger w/o underlying fracture.   Trephination performed successfully  with moderate amount of bright red blood drained from under the nail.  Bacitracin and pressure bandage applied. Will start on Keflex to help prevent secondary infection F/u with PCP as needed.    Final Clinical Impressions(s) / UC Diagnoses   Final diagnoses:  Right hand pain  Finger pain, right  Subungual hematoma of digit of hand, initial encounter  Crushing injury of right index finger, initial encounter    New Prescriptions Discharge Medication List  as of 12/13/2016  3:06 PM    START taking these medications   Details  cephALEXin (KEFLEX) 500 MG capsule Take 1 capsule (500 mg total) by mouth 2 (two) times daily., Starting Thu 12/13/2016, Normal         Controlled Substance Prescriptions [place] Controlled Substance Registry consulted? Not Applicable   Rolla Plate 12/13/16 1742

## 2016-12-13 NOTE — ED Triage Notes (Signed)
Right index finger crush injury x 5 days ago

## 2019-04-01 ENCOUNTER — Telehealth: Payer: BLUE CROSS/BLUE SHIELD | Admitting: Physician Assistant

## 2019-04-11 ENCOUNTER — Other Ambulatory Visit: Payer: Self-pay

## 2019-04-11 ENCOUNTER — Emergency Department (HOSPITAL_COMMUNITY): Payer: Medicaid Other

## 2019-04-11 ENCOUNTER — Emergency Department (HOSPITAL_COMMUNITY)
Admission: EM | Admit: 2019-04-11 | Discharge: 2019-04-11 | Disposition: A | Discharge status: Home / Self Care | Payer: Medicaid Other | Source: Home / Self Care | Attending: Emergency Medicine | Admitting: Emergency Medicine

## 2019-04-11 ENCOUNTER — Inpatient Hospital Stay (HOSPITAL_COMMUNITY)
Admission: EM | Admit: 2019-04-11 | Discharge: 2019-04-15 | DRG: 660 | Disposition: A | Discharge status: Home / Self Care | Payer: Medicaid Other | Attending: Internal Medicine | Admitting: Internal Medicine

## 2019-04-11 DIAGNOSIS — F319 Bipolar disorder, unspecified: Secondary | ICD-10-CM | POA: Diagnosis present

## 2019-04-11 DIAGNOSIS — B9620 Unspecified Escherichia coli [E. coli] as the cause of diseases classified elsewhere: Secondary | ICD-10-CM | POA: Diagnosis present

## 2019-04-11 DIAGNOSIS — Z79899 Other long term (current) drug therapy: Secondary | ICD-10-CM | POA: Insufficient documentation

## 2019-04-11 DIAGNOSIS — R809 Proteinuria, unspecified: Secondary | ICD-10-CM | POA: Insufficient documentation

## 2019-04-11 DIAGNOSIS — Z888 Allergy status to other drugs, medicaments and biological substances status: Secondary | ICD-10-CM

## 2019-04-11 DIAGNOSIS — N3001 Acute cystitis with hematuria: Secondary | ICD-10-CM | POA: Insufficient documentation

## 2019-04-11 DIAGNOSIS — N201 Calculus of ureter: Secondary | ICD-10-CM

## 2019-04-11 DIAGNOSIS — N136 Pyonephrosis: Principal | ICD-10-CM | POA: Diagnosis present

## 2019-04-11 DIAGNOSIS — Z1611 Resistance to penicillins: Secondary | ICD-10-CM | POA: Diagnosis present

## 2019-04-11 DIAGNOSIS — F419 Anxiety disorder, unspecified: Secondary | ICD-10-CM | POA: Diagnosis present

## 2019-04-11 DIAGNOSIS — Z20822 Contact with and (suspected) exposure to covid-19: Secondary | ICD-10-CM | POA: Diagnosis present

## 2019-04-11 DIAGNOSIS — Z8249 Family history of ischemic heart disease and other diseases of the circulatory system: Secondary | ICD-10-CM

## 2019-04-11 DIAGNOSIS — Z882 Allergy status to sulfonamides status: Secondary | ICD-10-CM

## 2019-04-11 DIAGNOSIS — R310 Gross hematuria: Secondary | ICD-10-CM | POA: Diagnosis present

## 2019-04-11 DIAGNOSIS — N12 Tubulo-interstitial nephritis, not specified as acute or chronic: Secondary | ICD-10-CM | POA: Diagnosis present

## 2019-04-11 LAB — CBC WITH DIFFERENTIAL/PLATELET
Abs Immature Granulocytes: 0.06 10*3/uL (ref 0.00–0.07)
Abs Immature Granulocytes: 0.08 10*3/uL — ABNORMAL HIGH (ref 0.00–0.07)
Basophils Absolute: 0 10*3/uL (ref 0.0–0.1)
Basophils Absolute: 0 10*3/uL (ref 0.0–0.1)
Basophils Relative: 0 %
Basophils Relative: 0 %
Eosinophils Absolute: 0 10*3/uL (ref 0.0–0.5)
Eosinophils Absolute: 0.3 10*3/uL (ref 0.0–0.5)
Eosinophils Relative: 0 %
Eosinophils Relative: 4 %
HCT: 39.2 % (ref 39.0–52.0)
HCT: 41.8 % (ref 39.0–52.0)
Hemoglobin: 13.3 g/dL (ref 13.0–17.0)
Hemoglobin: 14 g/dL (ref 13.0–17.0)
Immature Granulocytes: 0 %
Immature Granulocytes: 1 %
Lymphocytes Relative: 31 %
Lymphocytes Relative: 7 %
Lymphs Abs: 1.1 10*3/uL (ref 0.7–4.0)
Lymphs Abs: 2.1 10*3/uL (ref 0.7–4.0)
MCH: 28.2 pg (ref 26.0–34.0)
MCH: 28.5 pg (ref 26.0–34.0)
MCHC: 33.5 g/dL (ref 30.0–36.0)
MCHC: 33.9 g/dL (ref 30.0–36.0)
MCV: 84.1 fL (ref 80.0–100.0)
MCV: 84.3 fL (ref 80.0–100.0)
Monocytes Absolute: 0.7 10*3/uL (ref 0.1–1.0)
Monocytes Absolute: 0.7 10*3/uL (ref 0.1–1.0)
Monocytes Relative: 11 %
Monocytes Relative: 5 %
Neutro Abs: 12.6 10*3/uL — ABNORMAL HIGH (ref 1.7–7.7)
Neutro Abs: 3.7 10*3/uL (ref 1.7–7.7)
Neutrophils Relative %: 53 %
Neutrophils Relative %: 88 %
Platelets: 248 10*3/uL (ref 150–400)
Platelets: 293 10*3/uL (ref 150–400)
RBC: 4.66 MIL/uL (ref 4.22–5.81)
RBC: 4.96 MIL/uL (ref 4.22–5.81)
RDW: 11.9 % (ref 11.5–15.5)
RDW: 12 % (ref 11.5–15.5)
WBC: 14.5 10*3/uL — ABNORMAL HIGH (ref 4.0–10.5)
WBC: 7 10*3/uL (ref 4.0–10.5)
nRBC: 0 % (ref 0.0–0.2)
nRBC: 0 % (ref 0.0–0.2)

## 2019-04-11 LAB — COMPREHENSIVE METABOLIC PANEL
ALT: 42 U/L (ref 0–44)
AST: 25 U/L (ref 15–41)
Albumin: 4.4 g/dL (ref 3.5–5.0)
Alkaline Phosphatase: 73 U/L (ref 38–126)
Anion gap: 9 (ref 5–15)
BUN: 21 mg/dL — ABNORMAL HIGH (ref 6–20)
CO2: 29 mmol/L (ref 22–32)
Calcium: 9.2 mg/dL (ref 8.9–10.3)
Chloride: 101 mmol/L (ref 98–111)
Creatinine, Ser: 1.4 mg/dL — ABNORMAL HIGH (ref 0.61–1.24)
GFR calc Af Amer: >60 mL/min (ref >60)
GFR calc non Af Amer: >60 mL/min (ref >60)
Glucose, Bld: 113 mg/dL — ABNORMAL HIGH (ref 70–99)
Potassium: 3.8 mmol/L (ref 3.5–5.1)
Sodium: 139 mmol/L (ref 135–145)
Total Bilirubin: 0.4 mg/dL (ref 0.3–1.2)
Total Protein: 7.7 g/dL (ref 6.5–8.1)

## 2019-04-11 LAB — URINALYSIS, ROUTINE W REFLEX MICROSCOPIC
Bilirubin Urine: NEGATIVE
Glucose, UA: NEGATIVE mg/dL
Ketones, ur: NEGATIVE mg/dL
Leukocytes,Ua: NEGATIVE
Nitrite: NEGATIVE
Protein, ur: 100 mg/dL — AB
RBC / HPF: >50 RBC/hpf — ABNORMAL HIGH (ref 0–5)
Specific Gravity, Urine: 1.017 (ref 1.005–1.030)
WBC, UA: >50 WBC/hpf — ABNORMAL HIGH (ref 0–5)
pH: 6 (ref 5.0–8.0)

## 2019-04-11 MED ORDER — OXYCODONE-ACETAMINOPHEN 5-325 MG PO TABS: 1.0000 | ORAL_TABLET | Freq: Three times a day (TID) | ORAL | 0 refills | Status: DC | PRN

## 2019-04-11 MED ORDER — CEPHALEXIN 500 MG PO CAPS: 500.0000 mg | ORAL_CAPSULE | Freq: Four times a day (QID) | ORAL | 0 refills | Status: DC

## 2019-04-11 MED ORDER — FENTANYL CITRATE (PF) 100 MCG/2ML IJ SOLN
50.0000 ug | INTRAMUSCULAR | Status: DC | PRN
  Administered 2019-04-11 (×2): 50 ug via INTRAVENOUS
  Filled 2019-04-11 (×2): qty 2

## 2019-04-11 MED ORDER — ACETAMINOPHEN 500 MG PO TABS
1000.0000 mg | ORAL_TABLET | Freq: Once | ORAL | Status: AC
  Administered 2019-04-12: 1000 mg via ORAL
  Filled 2019-04-11: qty 2

## 2019-04-11 MED ORDER — FENTANYL CITRATE (PF) 100 MCG/2ML IJ SOLN
75.0000 ug | Freq: Once | INTRAMUSCULAR | Status: AC
Start: 2019-04-11 — End: 2019-04-11
  Administered 2019-04-11: 75 ug via INTRAMUSCULAR
  Filled 2019-04-11: qty 2

## 2019-04-11 MED ORDER — SODIUM CHLORIDE 0.9 % IV BOLUS (SEPSIS)
1000.0000 mL | Freq: Once | INTRAVENOUS | Status: AC
  Administered 2019-04-11: 1000 mL via INTRAVENOUS

## 2019-04-11 MED ORDER — TAMSULOSIN HCL 0.4 MG PO CAPS: 0.4000 mg | ORAL_CAPSULE | Freq: Every day | ORAL | 0 refills | Status: DC

## 2019-04-11 MED ORDER — SODIUM CHLORIDE 0.9 % IV SOLN
1.0000 g | Freq: Once | INTRAVENOUS | Status: AC
  Administered 2019-04-11: 1 g via INTRAVENOUS
  Filled 2019-04-11: qty 10

## 2019-04-11 MED ORDER — ONDANSETRON HCL 4 MG/2ML IJ SOLN
4.0000 mg | Freq: Once | INTRAMUSCULAR | Status: AC
  Administered 2019-04-11: 4 mg via INTRAVENOUS
  Filled 2019-04-11: qty 2

## 2019-04-11 MED ORDER — SODIUM CHLORIDE 0.9 % IV BOLUS
1000.0000 mL | Freq: Once | INTRAVENOUS | Status: AC
  Administered 2019-04-11: 1000 mL via INTRAVENOUS

## 2019-04-11 NOTE — ED Triage Notes (Signed)
Pt presents to ED from home with c/o bilateral flank pain, dysuria, hematuria since last night. Pt was seen here last night for same but symptoms have worsened but pain is better with steam showers and OTC meds, is just really bad when he says it feels like his "kidney is full".

## 2019-04-11 NOTE — Consult Note (Signed)
Urology Consult Note   Requesting Attending Physician:  Joline Maxcy, PA-C Service Providing Consult: Urology  Consulting Attending: McKenzie  Reason for Consult:  Hematuria  HPI: Kenneth Crawford is seen in consultation for reasons noted above at the request of Mia McDonald, PA-C   This is a 34 y.o. male with with history of bipolar disorder who presented to emergency room on 04/10/2018 with hematuria.  He had flank pain and subjective fever approximately 1 week ago and then passed several small stones which he collected in his urine.  Then last night started developing hematuria with passage of blood clots as well as left flank pain.  Subjective fevers at home several days ago which have resolved.  He is been taking Bactrim which was a leftover prescription from his significant other.  Upon presentation to the emergency room he was afebrile and hemodynamically stable.  No leukocytosis with a white count of 7.  CT scan was performed which showed moderate left hydronephrosis with clot in the left proximal ureter.  There were no stones.  Feels like he is emptying his bladder reasonably well and postvoid residual bladder scan was 162.  Urinalysis showed negative leukocyte esterase, negative nitrite but did contain greater than 50 WBC and many bacteria.   No prior urologic history or prior urologic surgery.  Is not on anticoagulation.  No history of smoking, radiation, risk factors for malignancy.  Past Medical History: Past Medical History:  Diagnosis Date  . Anxiety   . Bipolar 1 disorder ([place])   . H1N1 influenza    2009    Past Surgical History:  Past Surgical History:  Procedure Laterality Date  . ADENOIDECTOMY    . WISDOM TOOTH EXTRACTION      Medication: Current Facility-Administered Medications  Medication Dose Route Frequency Provider Last Rate Last Admin  . fentaNYL (SUBLIMAZE) injection 50 mcg  50 mcg Intravenous Q1H PRN McDonald, Mia A, PA-C   50 mcg at 04/11/19 0546    Current Outpatient Medications  Medication Sig Dispense Refill  . acetaminophen-codeine (TYLENOL #3) 300-30 MG tablet Take 1 tablet by mouth once.    . Ascorbic Acid (VITAMIN C) 1000 MG tablet Take 1,000 mg by mouth 2 (two) times daily.    Marland Kitchen ibuprofen (ADVIL) 200 MG tablet Take 400 mg by mouth every 6 (six) hours as needed for headache, mild pain, moderate pain or cramping.    . sulfamethoxazole-trimethoprim (BACTRIM) 400-80 MG tablet Take 1 tablet by mouth 2 (two) times daily.    . cephALEXin (KEFLEX) 500 MG capsule Take 1 capsule (500 mg total) by mouth 4 (four) times daily for 5 days. 20 capsule 0  . oxyCODONE-acetaminophen (PERCOCET/ROXICET) 5-325 MG tablet Take 1 tablet by mouth every 8 (eight) hours as needed for severe pain. 8 tablet 0  . tamsulosin (FLOMAX) 0.4 MG CAPS capsule Take 1 capsule (0.4 mg total) by mouth daily for 14 days. 14 capsule 0    Allergies: Allergies  Allergen Reactions  . 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  Joanell Rising [Quetiapine Fumarate]     "sore throat, blisters in throat, migraine, high levels of aggression"  . Sulfa Antibiotics     unknown  . Sertraline Rash    Social History: Social History   Tobacco Use  . Smoking status: Never Smoker  . Smokeless tobacco: Never Used  Substance Use Topics  . Alcohol use: Yes    Comment: occ  . Drug  use: No    Family History Family History  Problem Relation Age of Onset  . Hypertension Father     Review of Systems 10 systems were reviewed and are negative except as noted specifically in the HPI.  Objective   Vital signs in last 24 hours: BP (!) 99/59   Pulse 88   Temp 97.9 F (36.6 C) (Oral)   Resp 16   Ht 5\' 11"  (1.803 m)   Wt 72.6 kg   SpO2 99%   BMI 22.32 kg/m   Physical Exam General Appearance:  No acute distress. Alert and oriented x 3. Pulmonary: Normal respiratory effort on room air Cardiovascular: Regular  rate Abdomen: Soft, non-tender, without masses. Musculoskeletal: Normal gait. Extremities without edema. GU: Mild left CVA tenderness Neurologic:  No motor abnormalities noted.    Most Recent Labs: Lab Results  Component Value Date   WBC 7.0 04/11/2019   HGB 13.3 04/11/2019   HCT 39.2 04/11/2019   PLT 293 04/11/2019    Lab Results  Component Value Date   NA 139 04/11/2019   K 3.8 04/11/2019   CL 101 04/11/2019   CO2 29 04/11/2019   BUN 21 (H) 04/11/2019   CREATININE 1.40 (H) 04/11/2019   CALCIUM 9.2 04/11/2019    No results found for: INR, APTT   IMAGING: CT Renal Stone Study  Result Date: 04/11/2019 CLINICAL DATA:  Hematuria. Bilateral flank pain. EXAM: CT ABDOMEN AND PELVIS WITHOUT CONTRAST TECHNIQUE: Multidetector CT imaging of the abdomen and pelvis was performed following the standard protocol without IV contrast. COMPARISON:  CT 04/15/2016 FINDINGS: Lower chest: The lung bases are clear. Hepatobiliary: No focal hepatic abnormality. Small gallstone within the gallbladder. No pericholecystic inflammation. No biliary dilatation. Pancreas: No ductal dilatation or inflammation. Spleen: Normal in size without focal abnormality. Adrenals/Urinary Tract: Normal adrenal glands. No right hydronephrosis or hydroureter. No right nephrolithiasis. Slight indistinctness of right perirenal fat. Mild left hydronephrosis. Vague high density in the left ureteropelvic junction and proximal ureter, series 2, images 32 and 33 the more distal ureter is decompressed. Calcification in the left pelvis represents a phlebolith, and was seen on prior exam. Urinary bladder is minimally distended. No bladder stone or wall thickening. No urethral stones visualized. Stomach/Bowel: Stomach is within normal limits. Appendix appears normal, best visualized on series 5, image 63. No evidence of bowel wall thickening, distention, or inflammatory changes. Moderate stool in the proximal colon. Vascular/Lymphatic:  Abdominal aorta is normal in caliber. No bulky abdominopelvic adenopathy. Reproductive: Prostate is unremarkable. Other: No free air, free fluid, or intra-abdominal fluid collection. Musculoskeletal: Scoliotic curvature of the spine. There are no acute or suspicious osseous abnormalities. IMPRESSION: 1. Mild left hydronephrosis with ill-defined high density in the left ureteropelvic junction and proximal ureter. High density may represent blood products or small stones. 2. Gallstone without gallbladder inflammation. Electronically Signed   By: 04/17/2016 M.D.   On: 04/11/2019 03:58    ------  Assessment:  34 y.o. male with hematuria likely result of recent stone passage.  Although he is clot blocking his left ureter this should pass on its own.  Gave strict return precautions for infection and recommend empiric antibiotics while culture is pending.  AKI is likely due to NSAID and Bactrim use.  Recommendations: -Hydrate with to 3 L of fluid per day -Flomax, 0.4 mg daily for 2 weeks -Pain meds, try to avoid NSAIDs -Follow-up with urology in 1 to 2 weeks for renal ultrasound and BMP.  Told to call our  office and schedule appointment -Send urine for culture -Stop Bactrim, start Keflex while culture is pending -Return precautions for fevers, chills, nausea, vomiting that is persistent  Thank you for this consult. Please contact the urology consult pager with any further questions/concerns.

## 2019-04-11 NOTE — ED Provider Notes (Signed)
TIME SEEN: 11:19 PM  CHIEF COMPLAINT: Left flank pain  HPI: Patient is a 34 year old male who presents to the emergency department with left flank pain.  He was seen yesterday for the same and had a CT scan that showed left mild hydronephrosis thought secondary to blood clots and not stones.  Did report that he had passed stones at home.  Was seen by urology and discharged home on Keflex and with pain medication.  Unable to take Flomax secondary to a sulfa allergy per patient's fianc.  Patient reports he has had a kidney infection before that has improved with Bactrim.  States when the symptoms started several days ago with fever and flank pain and difficulty urinating and blood in his urine he started taking his girlfriends leftover Bactrim without much relief.  Bactrim was stopped yesterday given elevated renal function.  Switch to Keflex.  Comes back today with pain that was previously uncontrolled but now feels like his oral pain medicine from home is starting to work.  He has had nausea and vomiting.  No diarrhea.  Still having fevers.  Feels he is able to empty his bladder.  Still seeing some gross hematuria.  Reports he has been taking Keflex since discharge.  No other infectious symptoms including cough, chest pain, shortness of breath, loss of taste or smell.    ROS: See HPI Constitutional:  fever  Eyes: no drainage  ENT: no runny nose   Cardiovascular:  no chest pain  Resp: no SOB  GI:  vomiting GU: Hematuria, dysuria Integumentary: no rash  Allergy: no hives  Musculoskeletal: no leg swelling  Neurological: no slurred speech ROS otherwise negative  PAST MEDICAL HISTORY/PAST SURGICAL HISTORY:  Past Medical History:  Diagnosis Date  . Anxiety   . Bipolar 1 disorder (HCC)   . H1N1 influenza    2009    MEDICATIONS:  Prior to Admission medications   Medication Sig Start Date End Date Taking? Authorizing Provider  Ascorbic Acid (VITAMIN C) 1000 MG tablet Take 1,000 mg by mouth 2  (two) times daily.   Yes [provider]  cephALEXin (KEFLEX) 500 MG capsule Take 1 capsule (500 mg total) by mouth 4 (four) times daily for 5 days. 04/11/19 04/16/19 Yes McDonald, Mia A, PA-C  ibuprofen (ADVIL) 200 MG tablet Take 400 mg by mouth every 6 (six) hours as needed for headache, mild pain, moderate pain or cramping.   Yes [provider]  oxyCODONE-acetaminophen (PERCOCET/ROXICET) 5-325 MG tablet Take 1 tablet by mouth every 8 (eight) hours as needed for severe pain. 04/11/19  Yes McDonald, Mia A, PA-C  tamsulosin (FLOMAX) 0.4 MG CAPS capsule Take 1 capsule (0.4 mg total) by mouth daily for 14 days. 04/11/19 04/25/19  McDonald, Mia A, PA-C    ALLERGIES:  Allergies  Allergen Reactions  . 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  Neysa Bonito [Quetiapine Fumarate]     "sore throat, blisters in throat, migraine, high levels of aggression"  . Sulfa Antibiotics Swelling  . Sertraline Rash    SOCIAL HISTORY:  Social History   Tobacco Use  . Smoking status: Never Smoker  . Smokeless tobacco: Never Used  Substance Use Topics  . Alcohol use: Yes    Comment: occ    FAMILY HISTORY: Family History  Problem Relation Age of Onset  . Hypertension Father     EXAM: BP 113/69   Pulse 93   Temp 99.6 F (  37.6 C) (Oral)   Resp 18   SpO2 97%  CONSTITUTIONAL: Alert and oriented and responds appropriately to questions. Well-appearing; well-nourished HEAD: Normocephalic EYES: Conjunctivae clear, pupils appear equal, EOM appear intact ENT: normal nose; moist mucous membranes NECK: Supple, normal ROM CARD: RRR; S1 and S2 appreciated; no murmurs, no clicks, no rubs, no gallops RESP: Normal chest excursion without splinting or tachypnea; breath sounds clear and equal bilaterally; no wheezes, no rhonchi, no rales, no hypoxia or respiratory distress, speaking full sentences ABD/GI: Normal bowel sounds;  non-distended; soft, non-tender, no rebound, no guarding, no peritoneal signs, no hepatosplenomegaly BACK:  The back appears normal EXT: Normal ROM in all joints; no deformity noted, no edema; no cyanosis SKIN: Normal color for age and race; warm; no rash on exposed skin NEURO: Moves all extremities equally PSYCH: The patient's mood and manner are appropriate.   MEDICAL DECISION MAKING: Patient here with what I suspect is pyelonephritis.  Was seen by urology yesterday and they did not think that he had an infected stone but rather thought that the debris seen on his CT scan was likely from blood clots and recommended increased hydration, Keflex, pain medication and outpatient follow-up for repeat BMP and renal ultrasound in a week.  Did have a mildly elevated creatinine of 1.4 and was advised to avoid NSAIDs.  Comes back today with pain that was previously uncontrolled but states now he feels like his pain medicine is finally starting to work.  Found to have fever of 103 here.  Will repeat labs, urine.  Will give IV fluids, Zofran, Rocephin.  I discussed with patient he may need admission to the hospital.  ED PROGRESS: Labs show leukocytosis of 14.5 with left shift.  Creatinine improving but still elevated at 1.31.  Urine still shows a large amount of white blood cells, red blood cells but now the many bacteria that was seen yesterday is now gone and no bacteria seen.  Repeat blood and urine cultures are pending.  Urine culture yesterday has not resulted.  Patient given Tylenol for his fever.   1:10 AM  Pt reports pain is starting to come back.  I have recommended admission given he is not improving with outpatient therapy.  He agrees to this plan.  We will continue IV fluids.  Will give fentanyl for pain.  Will discuss with hospitalist for admission for pyelonephritis.   1:34 AM Discussed patient's case with hospitalist, Dr. Shanon Brow.  I have recommended admission and patient (and family if present)  agree with this plan. Admitting physician will place admission orders.   I reviewed all nursing notes, vitals, pertinent previous records and interpreted all EKGs, lab and urine results, imaging (as available).   CRITICAL CARE Performed by: Pryor Curia   Total critical care time: 45 minutes  Critical care time was exclusive of separately billable procedures and treating other patients.  Critical care was necessary to treat or prevent imminent or life-threatening deterioration.  Critical care was time spent personally by me on the following activities: development of treatment plan with patient and/or surrogate as well as nursing, discussions with consultants, evaluation of patient's response to treatment, examination of patient, obtaining history from patient or surrogate, ordering and performing treatments and interventions, ordering and review of laboratory studies, ordering and review of radiographic studies, pulse oximetry and re-evaluation of patient's condition.   Shaquille Janes was evaluated in Emergency Department on 04/11/2019 for the symptoms described in the history of present illness. He was evaluated in the  context of the global COVID-19 pandemic, which necessitated consideration that the patient might be at risk for infection with the SARS-CoV-2 virus that causes COVID-19. Institutional protocols and algorithms that pertain to the evaluation of patients at risk for COVID-19 are in a state of rapid change based on information released by regulatory bodies including the CDC and federal and state organizations. These policies and algorithms were followed during the patient's care in the ED.  Patient was seen wearing N95, face shield, gloves.    Tonny Isensee, Layla Maw, DO 04/12/19 [phone]

## 2019-04-11 NOTE — ED Provider Notes (Addendum)
[facility]-EMERGENCY DEPT Provider Note   CSN: [id number] Arrival date & time: 04/11/19  0139     History Chief Complaint  Patient presents with  . Flank Pain  . Hematuria    Kenneth Crawford is a 34 y.o. male with a history of bipolar 1 disorder, delusional disorder, and anxiety who presents to the emergency department with a chief complaint of hematuria.  The patient reports that approximately 1 week ago that he developed subjective fever, chills, dysuria and started taking a 10-day course of Bactrim that was prescribed for his significant other.  He reports that he has completed 8 days of Bactrim.  Subjective fever and chills have resolved, but the patient reports that he was having right-sided flank pain earlier this week and suspects that he passed to kidney stones that may have been from the right side.  Prior to this, he denies any history of kidney stones.  Today, he developed left flank and left lower abdominal pain accompanied by bright red hematuria.  Pain is constant and cramping.  He reports that he is having a difficult time finding a comfortable position to stand or sit due to the pain.  Reports that he had one episode of vomiting and some nausea earlier today, but this has since resolved.  He denies diarrhea, constipation, penile or testicular pain or swelling, penile discharge, shortness of breath, cough, or sore throat.  No other treatment prior to arrival.  He also notes that his urine was brown in color for several days until today.  He notes that for the last few weeks that he has been drinking a significant amount of tea.  The history is provided by the patient. No language interpreter was used.       Past Medical History:  Diagnosis Date  . Anxiety   . Bipolar 1 disorder (HCC)   . H1N1 influenza    2009    Patient Active Problem List   Diagnosis Date Noted  . Delusional disorder (HCC)   . Bipolar disorder, current episode depressed,  severe, with psychotic features [facility])     Past Surgical History:  Procedure Laterality Date  . ADENOIDECTOMY    . WISDOM TOOTH EXTRACTION         Family History  Problem Relation Age of Onset  . Hypertension Father     Social History   Tobacco Use  . Smoking status: Never Smoker  . Smokeless tobacco: Never Used  Substance Use Topics  . Alcohol use: Yes    Comment: occ  . Drug use: No    Home Medications Prior to Admission medications   Medication Sig Start Date End Date Taking? Authorizing Provider  acetaminophen-codeine (TYLENOL #3) 300-30 MG tablet Take 1 tablet by mouth once.   Yes [provider]  Ascorbic Acid (VITAMIN C) 1000 MG tablet Take 1,000 mg by mouth 2 (two) times daily.   Yes [provider]  ibuprofen (ADVIL) 200 MG tablet Take 400 mg by mouth every 6 (six) hours as needed for headache, mild pain, moderate pain or cramping.   Yes [provider]  sulfamethoxazole-trimethoprim (BACTRIM) 400-80 MG tablet Take 1 tablet by mouth 2 (two) times daily.   Yes [provider]  cephALEXin (KEFLEX) 500 MG capsule Take 1 capsule (500 mg total) by mouth 4 (four) times daily for 5 days. 04/11/19 04/16/19  Kayloni Rocco A, PA-C  oxyCODONE-acetaminophen (PERCOCET/ROXICET) 5-325 MG tablet Take 1 tablet by mouth every 8 (eight) hours as needed  for severe pain. 04/11/19   Sidi Dzikowski A, PA-C  tamsulosin (FLOMAX) 0.4 MG CAPS capsule Take 1 capsule (0.4 mg total) by mouth daily for 14 days. 04/11/19 04/25/19  Siren Porrata A, PA-C    Allergies    Other, Seroquel [quetiapine fumarate], Sulfa antibiotics, and Sertraline  Review of Systems   Review of Systems  Constitutional: Positive for chills (resolved) and fever (resolved). Negative for appetite change.  Respiratory: Negative for shortness of breath.   Cardiovascular: Negative for chest pain.  Gastrointestinal: Positive for abdominal pain, nausea and vomiting. Negative for blood in stool,  constipation, diarrhea and rectal pain.  Genitourinary: Positive for dysuria, flank pain and hematuria. Negative for discharge, frequency, penile pain, penile swelling, scrotal swelling and urgency.  Musculoskeletal: Negative for back pain.  Skin: Negative for rash.  Allergic/Immunologic: Negative for immunocompromised state.  Neurological: Negative for seizures, syncope, weakness, numbness and headaches.  Psychiatric/Behavioral: Negative for confusion.    Physical Exam Updated Vital Signs BP (!) 99/59   Pulse 88   Temp 97.9 F (36.6 C) (Oral)   Resp 16   Ht 5\' 11"  (1.803 m)   Wt 72.6 kg   SpO2 99%   BMI 22.32 kg/m   Physical Exam Vitals and nursing note reviewed.  Constitutional:      Appearance: He is well-developed.     Comments: Well-appearing.  Nontoxic.  Writhing around on the exam chair.  HENT:     Head: Normocephalic.  Eyes:     Conjunctiva/sclera: Conjunctivae normal.  Cardiovascular:     Rate and Rhythm: Normal rate and regular rhythm.     Heart sounds: No murmur.  Pulmonary:     Effort: Pulmonary effort is normal.  Abdominal:     General: There is no distension.     Palpations: Abdomen is soft. There is no mass.     Tenderness: There is abdominal tenderness. There is no right CVA tenderness, left CVA tenderness, guarding or rebound.     Hernia: No hernia is present.     Comments: Abdomen is soft, nondistended.  Tender to palpation in the left lower quadrant and suprapubic region.  No CVA tenderness bilaterally.  No tenderness over McBurney's point.  Negative Murphy sign.  Normoactive bowel sounds.  Musculoskeletal:     Cervical back: Neck supple.  Skin:    General: Skin is warm and dry.  Neurological:     Mental Status: He is alert.  Psychiatric:        Behavior: Behavior normal.     ED Results / Procedures / Treatments   Labs (all labs ordered are listed, but only abnormal results are displayed) Labs Reviewed  URINALYSIS, ROUTINE W REFLEX  MICROSCOPIC - Abnormal; Notable for the following components:      Result Value   APPearance CLOUDY (*)    Hgb urine dipstick MODERATE (*)    Protein, ur 100 (*)    RBC / HPF >50 (*)    WBC, UA >50 (*)    Bacteria, UA MANY (*)    All other components within normal limits  CBC WITH DIFFERENTIAL/PLATELET - Abnormal; Notable for the following components:   Abs Immature Granulocytes 0.08 (*)    All other components within normal limits  COMPREHENSIVE METABOLIC PANEL - Abnormal; Notable for the following components:   Glucose, Bld 113 (*)    BUN 21 (*)    Creatinine, Ser 1.40 (*)    All other components within normal limits  URINE CULTURE  EKG None  Radiology CT Renal Stone Study  Result Date: 04/11/2019 CLINICAL DATA:  Hematuria. Bilateral flank pain. EXAM: CT ABDOMEN AND PELVIS WITHOUT CONTRAST TECHNIQUE: Multidetector CT imaging of the abdomen and pelvis was performed following the standard protocol without IV contrast. COMPARISON:  CT 04/15/2016 FINDINGS: Lower chest: The lung bases are clear. Hepatobiliary: No focal hepatic abnormality. Small gallstone within the gallbladder. No pericholecystic inflammation. No biliary dilatation. Pancreas: No ductal dilatation or inflammation. Spleen: Normal in size without focal abnormality. Adrenals/Urinary Tract: Normal adrenal glands. No right hydronephrosis or hydroureter. No right nephrolithiasis. Slight indistinctness of right perirenal fat. Mild left hydronephrosis. Vague high density in the left ureteropelvic junction and proximal ureter, series 2, images 32 and 33 the more distal ureter is decompressed. Calcification in the left pelvis represents a phlebolith, and was seen on prior exam. Urinary bladder is minimally distended. No bladder stone or wall thickening. No urethral stones visualized. Stomach/Bowel: Stomach is within normal limits. Appendix appears normal, best visualized on series 5, image 63. No evidence of bowel wall thickening,  distention, or inflammatory changes. Moderate stool in the proximal colon. Vascular/Lymphatic: Abdominal aorta is normal in caliber. No bulky abdominopelvic adenopathy. Reproductive: Prostate is unremarkable. Other: No free air, free fluid, or intra-abdominal fluid collection. Musculoskeletal: Scoliotic curvature of the spine. There are no acute or suspicious osseous abnormalities. IMPRESSION: 1. Mild left hydronephrosis with ill-defined high density in the left ureteropelvic junction and proximal ureter. High density may represent blood products or small stones. 2. Gallstone without gallbladder inflammation. Electronically Signed   By: Narda Rutherford M.D.   On: 04/11/2019 03:58    Procedures Procedures (including critical care time)  Medications Ordered in ED Medications  fentaNYL (SUBLIMAZE) injection 50 mcg (50 mcg Intravenous Given 04/11/19 0546)  fentaNYL (SUBLIMAZE) injection 75 mcg (75 mcg Intramuscular Given 04/11/19 0209)  sodium chloride 0.9 % bolus 1,000 mL (0 mLs Intravenous Stopped 04/11/19 0539)    ED Course  I have reviewed the triage vital signs and the nursing notes.  Pertinent labs & imaging results that were available during my care of the patient were reviewed by me and considered in my medical decision making (see chart for details).    MDM Rules/Calculators/A&P                      34 year old male with a history of bipolar 1 disorder, delusional disorder, and anxiety presenting with subjective fever and chills and urinary complaints that began about a week ago that resolved after starting a course of Bactrim.  He suspects that he passed 2 kidney stones earlier this week.  Today, he developed hematuria and some left flank and lower abdominal pain. Reviewed pictures on the patient's phone of the multiple clots that he had passage of urine over the last 24 hours.  Vital signs are unremarkable in the ER.  On exam, he is writhing around on the exam bed and appears  uncomfortable.  Tender to palpation in the suprapubic region and left lower quadrant without peritoneal signs.  No CVA tenderness noted bilaterally.  Strong suspicion for obstructive uropathy.  However, patient had abdominal pelvic CT in the ER in 2018 with no evidence of kidney stones at that time.  He suspects that he passed 2 prior stones already earlier this week and may be passing 1/3 stone.  He does note he has been drinking a lot of tea for several weeks.  However, he also noticed that his urine has been brown  for several days until today.  Will check basic labs and order CT stone study.  Fentanyl given for pain control.  Creatinine is acutely elevated at 1.4.  Could be secondary to Bactrim versus decreased oral intake.  Patient does not clinically appear dehydrated.  IV fluids given in the ER.  UA with moderate hemoglobinuria, mild proteinuria, bacteriuria, RBCs, pyuria and WBC clumps.  No leukocyte esterase or nitrates.  Urine culture sent.  The patient was discussed with Dr. Wilhemina Cash, attending physician, the patient's CT demonstrated mild left hydronephrosis with an ill-defined high density in the left ureteropelvic junction and proximal ureter that may represent blood products or small stones.   Consulted urology and spoke with Dr. Tharon Aquas, urology resident.  He questions of AKI secondary to Bactrim. His initial recommendation is to ensure that the patient's urine is sent for culture, discharge the patient with Flomax and pain medication as his pain is well controlled in the ER, and have him follow-up with urology in 1 week.  He recommends PCP follow-up for proteinuria.  He also will come to evaluate the patient in the ER.  Spoke with Dr. Anthony Sar after he evaluated the patient.  He recommends continuing to send urine for culture and changing the patient's antibiotics to Keflex.  We will have the patient avoid NSAIDs.  He would like him on a 2-week course of Flomax and to be evaluated by the  urology office in 1 to 2 weeks.  PCP follow-up for proteinuria.  All questions answered.  He is hemodynamically stable and in no acute distress.  ER return precautions given.  Safe for discharge to home with outpatient urology follow-up and PCP follow-up.  Final Clinical Impression(s) / ED Diagnoses Final diagnoses:  Proteinuria, unspecified type  Ureterolithiasis  Acute cystitis with hematuria    Rx / DC Orders ED Discharge Orders         Ordered    oxyCODONE-acetaminophen (PERCOCET/ROXICET) 5-325 MG tablet  Every 8 hours PRN     04/11/19 0658    tamsulosin (FLOMAX) 0.4 MG CAPS capsule  Daily     04/11/19 0658    cephALEXin (KEFLEX) 500 MG capsule  4 times daily     04/11/19 0735           Brogan Martis A, PA-C 04/11/19 0710   ADDENDUM: MDM has been updated to reflect that the patient was not signed out to PA Sophia at shift change and was discharged by me with urology recommendations after the evaluated the patient.   Joanne Gavel, PA-C 04/11/19 0752    Maudie Flakes, MD 04/11/19 [phone]

## 2019-04-11 NOTE — Discharge Instructions (Addendum)
Thank you for allowing me to care for you today in the Emergency Department.   Take 1 tablet of Flomax daily until you pass the kidney stone.  Your kidney function was slightly elevated today.  I would avoid NSAIDs, including ibuprofen and naproxen for pain.  You can take 650 mg of Tylenol once every 6 hours.  For severe, uncontrollable pain, you can take 1 tablet of Percocet every 8 hours.  Each tablet of Percocet contains 325 mg of Tylenol.  Do not take more than 4000 mg of Tylenol in a 24-hour period.  Since your kidney function was slightly elevated, stop taking Bactrim as this can worsen this.  Start taking Keflex.  Take 1 tablet by mouth every 6 hours for the next 5 days.  Your urine also had some protein in it.  Please follow-up with primary care to have a repeat urinalysis in the next few weeks to ensure that this resolves.  Return to the emergency department if you develop high fevers, uncontrollable vomiting, if you stop producing urine, if your pain is uncontrollable at home, or if you develop other new, concerning symptoms.

## 2019-04-11 NOTE — ED Triage Notes (Signed)
Pt presents to the ED from home with c/o bilateral flank pain, hematuria and passing what he thinks are kidney stones. Denies this happening before. Pt is obvious discomfort from pain during triage.

## 2019-04-11 NOTE — ED Provider Notes (Signed)
Pt's significant other called.   She states that patient was not able to get the Flomax filled because he has a sulfa allergy.  She states that patient is still having agonizing flank pain although he seems to be urinating okay.  They are scheduled to follow-up with urology next week.  I advised that if the patient was still in severe pain and if he has any difficulty urinating or other worsening symptoms, he needs to return to the emergency room for reevaluation.  I advised this person that we are unable to do an adequate assessment over the phone.   Rolan Bucco, MD 04/11/19 1759

## 2019-04-12 ENCOUNTER — Encounter (HOSPITAL_COMMUNITY): Payer: Self-pay | Admitting: Family Medicine

## 2019-04-12 ENCOUNTER — Encounter (HOSPITAL_COMMUNITY)
Admission: EM | Disposition: A | Discharge status: Home / Self Care | Payer: Self-pay | Source: Home / Self Care | Attending: Internal Medicine

## 2019-04-12 ENCOUNTER — Inpatient Hospital Stay (HOSPITAL_COMMUNITY): Payer: Medicaid Other | Admitting: Certified Registered Nurse Anesthetist

## 2019-04-12 ENCOUNTER — Inpatient Hospital Stay (HOSPITAL_COMMUNITY): Payer: Medicaid Other

## 2019-04-12 ENCOUNTER — Other Ambulatory Visit: Payer: Self-pay

## 2019-04-12 DIAGNOSIS — B9620 Unspecified Escherichia coli [E. coli] as the cause of diseases classified elsewhere: Secondary | ICD-10-CM | POA: Diagnosis present

## 2019-04-12 DIAGNOSIS — N136 Pyonephrosis: Secondary | ICD-10-CM | POA: Diagnosis present

## 2019-04-12 DIAGNOSIS — R310 Gross hematuria: Secondary | ICD-10-CM | POA: Diagnosis present

## 2019-04-12 DIAGNOSIS — Z1611 Resistance to penicillins: Secondary | ICD-10-CM | POA: Diagnosis present

## 2019-04-12 DIAGNOSIS — F419 Anxiety disorder, unspecified: Secondary | ICD-10-CM | POA: Diagnosis present

## 2019-04-12 DIAGNOSIS — F319 Bipolar disorder, unspecified: Secondary | ICD-10-CM | POA: Diagnosis present

## 2019-04-12 DIAGNOSIS — Z20822 Contact with and (suspected) exposure to covid-19: Secondary | ICD-10-CM | POA: Diagnosis present

## 2019-04-12 DIAGNOSIS — N12 Tubulo-interstitial nephritis, not specified as acute or chronic: Secondary | ICD-10-CM

## 2019-04-12 DIAGNOSIS — R109 Unspecified abdominal pain: Secondary | ICD-10-CM | POA: Diagnosis present

## 2019-04-12 DIAGNOSIS — Z888 Allergy status to other drugs, medicaments and biological substances status: Secondary | ICD-10-CM | POA: Diagnosis not present

## 2019-04-12 DIAGNOSIS — Z8249 Family history of ischemic heart disease and other diseases of the circulatory system: Secondary | ICD-10-CM | POA: Diagnosis not present

## 2019-04-12 DIAGNOSIS — Z882 Allergy status to sulfonamides status: Secondary | ICD-10-CM | POA: Diagnosis not present

## 2019-04-12 HISTORY — PX: CYSTOSCOPY W/ URETERAL STENT PLACEMENT: SHX1429

## 2019-04-12 LAB — CBC
HCT: 35.3 % — ABNORMAL LOW (ref 39.0–52.0)
Hemoglobin: 11.8 g/dL — ABNORMAL LOW (ref 13.0–17.0)
MCH: 28.8 pg (ref 26.0–34.0)
MCHC: 33.4 g/dL (ref 30.0–36.0)
MCV: 86.1 fL (ref 80.0–100.0)
Platelets: 226 10*3/uL (ref 150–400)
RBC: 4.1 MIL/uL — ABNORMAL LOW (ref 4.22–5.81)
RDW: 11.9 % (ref 11.5–15.5)
WBC: 18.9 10*3/uL — ABNORMAL HIGH (ref 4.0–10.5)
nRBC: 0 % (ref 0.0–0.2)

## 2019-04-12 LAB — URINALYSIS, ROUTINE W REFLEX MICROSCOPIC
Bacteria, UA: NONE SEEN
Bilirubin Urine: NEGATIVE
Glucose, UA: NEGATIVE mg/dL
Ketones, ur: 20 mg/dL — AB
Nitrite: NEGATIVE
Protein, ur: NEGATIVE mg/dL
RBC / HPF: >50 RBC/hpf — ABNORMAL HIGH (ref 0–5)
Specific Gravity, Urine: 1.016 (ref 1.005–1.030)
WBC, UA: >50 WBC/hpf — ABNORMAL HIGH (ref 0–5)
pH: 5 (ref 5.0–8.0)

## 2019-04-12 LAB — BASIC METABOLIC PANEL
Anion gap: 6 (ref 5–15)
BUN: 14 mg/dL (ref 6–20)
CO2: 26 mmol/L (ref 22–32)
Calcium: 8.4 mg/dL — ABNORMAL LOW (ref 8.9–10.3)
Chloride: 105 mmol/L (ref 98–111)
Creatinine, Ser: 1.15 mg/dL (ref 0.61–1.24)
GFR calc Af Amer: >60 mL/min (ref >60)
GFR calc non Af Amer: >60 mL/min (ref >60)
Glucose, Bld: 112 mg/dL — ABNORMAL HIGH (ref 70–99)
Potassium: 4.1 mmol/L (ref 3.5–5.1)
Sodium: 137 mmol/L (ref 135–145)

## 2019-04-12 LAB — HEPATITIS PANEL, ACUTE
HCV Ab: NONREACTIVE
Hep A IgM: NONREACTIVE
Hep B C IgM: NONREACTIVE
Hepatitis B Surface Ag: NONREACTIVE

## 2019-04-12 LAB — HEMOGLOBIN A1C
Hgb A1c MFr Bld: 4.8 % (ref 4.8–5.6)
Mean Plasma Glucose: 91.06 mg/dL

## 2019-04-12 LAB — RAPID URINE DRUG SCREEN, HOSP PERFORMED
Amphetamines: NOT DETECTED
Barbiturates: NOT DETECTED
Benzodiazepines: NOT DETECTED
Cocaine: NOT DETECTED
Opiates: POSITIVE — AB
Tetrahydrocannabinol: NOT DETECTED

## 2019-04-12 LAB — COMPREHENSIVE METABOLIC PANEL
ALT: 36 U/L (ref 0–44)
AST: 25 U/L (ref 15–41)
Albumin: 4.7 g/dL (ref 3.5–5.0)
Alkaline Phosphatase: 72 U/L (ref 38–126)
Anion gap: 9 (ref 5–15)
BUN: 17 mg/dL (ref 6–20)
CO2: 26 mmol/L (ref 22–32)
Calcium: 9.4 mg/dL (ref 8.9–10.3)
Chloride: 100 mmol/L (ref 98–111)
Creatinine, Ser: 1.31 mg/dL — ABNORMAL HIGH (ref 0.61–1.24)
GFR calc Af Amer: >60 mL/min (ref >60)
GFR calc non Af Amer: >60 mL/min (ref >60)
Glucose, Bld: 102 mg/dL — ABNORMAL HIGH (ref 70–99)
Potassium: 4.3 mmol/L (ref 3.5–5.1)
Sodium: 135 mmol/L (ref 135–145)
Total Bilirubin: 1.2 mg/dL (ref 0.3–1.2)
Total Protein: 8.3 g/dL — ABNORMAL HIGH (ref 6.5–8.1)

## 2019-04-12 LAB — SARS CORONAVIRUS 2 (TAT 6-24 HRS): SARS Coronavirus 2: NEGATIVE

## 2019-04-12 LAB — VITAMIN B12: Vitamin B-12: 916 pg/mL — ABNORMAL HIGH (ref 180–914)

## 2019-04-12 LAB — GLUCOSE, CAPILLARY: Glucose-Capillary: 72 mg/dL (ref 70–99)

## 2019-04-12 LAB — LACTIC ACID, PLASMA: Lactic Acid, Venous: 0.7 mmol/L (ref 0.5–1.9)

## 2019-04-12 LAB — HIV ANTIBODY (ROUTINE TESTING W REFLEX): HIV Screen 4th Generation wRfx: NONREACTIVE

## 2019-04-12 SURGERY — CYSTOSCOPY, WITH RETROGRADE PYELOGRAM AND URETERAL STENT INSERTION
Anesthesia: General | Site: Ureter | Laterality: Left

## 2019-04-12 MED ORDER — SODIUM CHLORIDE 0.9 % IV SOLN: INTRAVENOUS | Status: DC

## 2019-04-12 MED ORDER — LACTATED RINGERS IV SOLN: INTRAVENOUS | Status: DC | PRN

## 2019-04-12 MED ORDER — FENTANYL CITRATE (PF) 100 MCG/2ML IJ SOLN
INTRAMUSCULAR | Status: DC | PRN
  Administered 2019-04-12 (×2): 50 ug via INTRAVENOUS

## 2019-04-12 MED ORDER — ACETAMINOPHEN 325 MG PO TABS
650.0000 mg | ORAL_TABLET | Freq: Four times a day (QID) | ORAL | Status: DC | PRN
  Administered 2019-04-12 – 2019-04-15 (×4): 650 mg via ORAL
  Filled 2019-04-12 (×4): qty 2

## 2019-04-12 MED ORDER — MIDAZOLAM HCL 5 MG/5ML IJ SOLN
INTRAMUSCULAR | Status: DC | PRN
  Administered 2019-04-12: 2 mg via INTRAVENOUS

## 2019-04-12 MED ORDER — LIDOCAINE 2% (20 MG/ML) 5 ML SYRINGE
INTRAMUSCULAR | Status: AC
  Filled 2019-04-12: qty 5

## 2019-04-12 MED ORDER — OXYCODONE HCL 5 MG PO TABS
5.0000 mg | ORAL_TABLET | ORAL | Status: DC | PRN
  Administered 2019-04-12 – 2019-04-15 (×11): 5 mg via ORAL
  Filled 2019-04-12 (×11): qty 1

## 2019-04-12 MED ORDER — SODIUM CHLORIDE 0.9 % IR SOLN
Status: DC | PRN
  Administered 2019-04-12: 3000 mL

## 2019-04-12 MED ORDER — LIDOCAINE 2% (20 MG/ML) 5 ML SYRINGE
INTRAMUSCULAR | Status: DC | PRN
  Administered 2019-04-12: 80 mg via INTRAVENOUS

## 2019-04-12 MED ORDER — ONDANSETRON HCL 4 MG/2ML IJ SOLN
INTRAMUSCULAR | Status: DC | PRN
  Administered 2019-04-12: 4 mg via INTRAVENOUS

## 2019-04-12 MED ORDER — ACETAMINOPHEN 650 MG RE SUPP: 650.0000 mg | Freq: Four times a day (QID) | RECTAL | Status: DC | PRN

## 2019-04-12 MED ORDER — PROPOFOL 10 MG/ML IV BOLUS
INTRAVENOUS | Status: AC
  Filled 2019-04-12: qty 20

## 2019-04-12 MED ORDER — ACETAMINOPHEN 500 MG PO TABS: 1000.0000 mg | ORAL_TABLET | Freq: Once | ORAL | Status: DC

## 2019-04-12 MED ORDER — MIDAZOLAM HCL 2 MG/2ML IJ SOLN
INTRAMUSCULAR | Status: AC
  Filled 2019-04-12: qty 2

## 2019-04-12 MED ORDER — IOHEXOL 300 MG/ML  SOLN
INTRAMUSCULAR | Status: DC | PRN
  Administered 2019-04-12: 50 mL

## 2019-04-12 MED ORDER — BELLADONNA ALKALOIDS-OPIUM 16.2-60 MG RE SUPP
RECTAL | Status: DC | PRN
  Administered 2019-04-12: 1 via RECTAL

## 2019-04-12 MED ORDER — BELLADONNA ALKALOIDS-OPIUM 16.2-30 MG RE SUPP
RECTAL | Status: AC
  Filled 2019-04-12: qty 1

## 2019-04-12 MED ORDER — SODIUM CHLORIDE 0.9 % IV SOLN
2.0000 g | Freq: Every day | INTRAVENOUS | Status: DC
  Administered 2019-04-12 – 2019-04-15 (×4): 2 g via INTRAVENOUS
  Filled 2019-04-12 (×4): qty 2

## 2019-04-12 MED ORDER — DEXAMETHASONE SODIUM PHOSPHATE 10 MG/ML IJ SOLN
INTRAMUSCULAR | Status: AC
  Filled 2019-04-12: qty 1

## 2019-04-12 MED ORDER — FENTANYL CITRATE (PF) 100 MCG/2ML IJ SOLN
50.0000 ug | INTRAMUSCULAR | Status: DC | PRN
  Administered 2019-04-13: 50 ug via INTRAVENOUS
  Filled 2019-04-12 (×2): qty 2

## 2019-04-12 MED ORDER — FENTANYL CITRATE (PF) 100 MCG/2ML IJ SOLN
50.0000 ug | Freq: Once | INTRAMUSCULAR | Status: AC
  Administered 2019-04-12: 50 ug via INTRAVENOUS
  Filled 2019-04-12: qty 2

## 2019-04-12 MED ORDER — ONDANSETRON HCL 4 MG/2ML IJ SOLN
INTRAMUSCULAR | Status: AC
  Filled 2019-04-12: qty 2

## 2019-04-12 MED ORDER — FENTANYL CITRATE (PF) 100 MCG/2ML IJ SOLN
INTRAMUSCULAR | Status: AC
  Filled 2019-04-12: qty 2

## 2019-04-12 MED ORDER — ONDANSETRON HCL 4 MG/2ML IJ SOLN
4.0000 mg | Freq: Four times a day (QID) | INTRAMUSCULAR | Status: DC | PRN
  Administered 2019-04-12: 4 mg via INTRAVENOUS
  Filled 2019-04-12: qty 2

## 2019-04-12 MED ORDER — ACETAMINOPHEN 500 MG PO TABS
ORAL_TABLET | ORAL | Status: AC
  Filled 2019-04-12: qty 2

## 2019-04-12 MED ORDER — PROPOFOL 10 MG/ML IV BOLUS
INTRAVENOUS | Status: DC | PRN
  Administered 2019-04-12: 180 mg via INTRAVENOUS

## 2019-04-12 MED ORDER — SUCCINYLCHOLINE CHLORIDE 200 MG/10ML IV SOSY
PREFILLED_SYRINGE | INTRAVENOUS | Status: AC
  Filled 2019-04-12: qty 10

## 2019-04-12 MED ORDER — ONDANSETRON HCL 4 MG PO TABS: 4.0000 mg | ORAL_TABLET | Freq: Four times a day (QID) | ORAL | Status: DC | PRN

## 2019-04-12 MED ORDER — OXYBUTYNIN CHLORIDE 5 MG PO TABS
5.0000 mg | ORAL_TABLET | Freq: Three times a day (TID) | ORAL | Status: DC | PRN
  Administered 2019-04-13 – 2019-04-15 (×7): 5 mg via ORAL
  Filled 2019-04-12 (×7): qty 1

## 2019-04-12 MED ORDER — DEXAMETHASONE SODIUM PHOSPHATE 10 MG/ML IJ SOLN
INTRAMUSCULAR | Status: DC | PRN
  Administered 2019-04-12: 10 mg via INTRAVENOUS

## 2019-04-12 MED ORDER — CHLORHEXIDINE GLUCONATE CLOTH 2 % EX PADS
6.0000 | MEDICATED_PAD | Freq: Every day | CUTANEOUS | Status: DC
  Administered 2019-04-12 – 2019-04-15 (×3): 6 via TOPICAL

## 2019-04-12 SURGICAL SUPPLY — 12 items
BAG URO CATCHER STRL LF (MISCELLANEOUS) ×3 IMPLANT
CATH INTERMIT  6FR 70CM (CATHETERS) ×3 IMPLANT
CLOTH BEACON ORANGE TIMEOUT ST (SAFETY) ×3 IMPLANT
GLOVE BIOGEL M 8.0 STRL (GLOVE) ×3 IMPLANT
GOWN STRL REUS W/TWL XL LVL3 (GOWN DISPOSABLE) ×6 IMPLANT
GUIDEWIRE STR DUAL SENSOR (WIRE) ×3 IMPLANT
KIT TURNOVER KIT A (KITS) IMPLANT
MANIFOLD NEPTUNE II (INSTRUMENTS) ×3 IMPLANT
PACK CYSTO (CUSTOM PROCEDURE TRAY) ×3 IMPLANT
STENT URET 6FRX26 CONTOUR (STENTS) ×3 IMPLANT
TUBING CONNECTING 10 (TUBING) ×2 IMPLANT
TUBING CONNECTING 10' (TUBING) ×1

## 2019-04-12 NOTE — Transfer of Care (Signed)
Immediate Anesthesia Transfer of Care Note  Patient: Kenneth Crawford  Procedure(s) Performed: CYSTOSCOPY WITH RETROGRADE PYELOGRAM/URETERAL STENT PLACEMENT (Left Ureter)  Patient Location: PACU  Anesthesia Type:General  Level of Consciousness: drowsy  Airway & Oxygen Therapy: Patient Spontanous Breathing and Patient connected to face mask oxygen  Post-op Assessment: Report given to RN and Post -op Vital signs reviewed and stable  Post vital signs: Reviewed and stable  Last Vitals:  Vitals Value Taken Time  BP 118/79 04/12/19 1830  Temp 37.5 C 04/12/19 1830  Pulse 92 04/12/19 1831  Resp 15 04/12/19 1831  SpO2 100 % 04/12/19 1831  Vitals shown include unvalidated device data.  Last Pain:  Vitals:   04/12/19 1722  TempSrc:   PainSc: 2       Patients Stated Pain Goal: 2 (04/12/19 1722)  Complications: No apparent anesthesia complications

## 2019-04-12 NOTE — Anesthesia Procedure Notes (Signed)
Procedure Name: LMA Insertion Date/Time: 04/12/2019 5:59 PM Performed by: Epimenio Sarin, CRNA Pre-anesthesia Checklist: Patient identified, Emergency Drugs available, Suction available, Patient being monitored and Timeout performed Patient Re-evaluated:Patient Re-evaluated prior to induction Oxygen Delivery Method: Circle system utilized Preoxygenation: Pre-oxygenation with 100% oxygen Induction Type: IV induction Ventilation: Mask ventilation without difficulty LMA: LMA with gastric port inserted LMA Size: 4.0 Number of attempts: 1 Dental Injury: Teeth and Oropharynx as per pre-operative assessment

## 2019-04-12 NOTE — Progress Notes (Addendum)
-  Admitted earlier today. -As per H and P "Kenneth Crawford is a 34 y.o. male with medical history significant of bipolar disorder comes in with over a week of bilateral flank pain more so on the left with gross hematuria without any diarrhea but vomiting at home.  Patient was recently evaluated in the emergency department yesterday and was started on Keflex.  Patient has vomited his Keflex over the last 24 hours several times.  He has significant amount of pain came back to the emergency department because of persistent pain and vomiting.  Urology saw him yesterday in the emergency department felt that his abnormality that he had on his CAT scan was more of a blood clot than a stone in his ureter.  He was unable to get the Flomax filled because he has an allergy to sulfa drugs.  Patient has received IV fluids and pain medications the emergency department and his pain is better.  He has been given Rocephin.  Patient referred for admission for acute pyelonephritis.  Review of Systems: As per HPI otherwise 10 point review of systems negative".   -Patient seen. -T-max of 103.1 F noted. -No nausea or vomiting. -Patient continues to report left CVA pain. -Patient's nurse informed me that the urology team has not seen patient today.  Apparently, patient is requesting to see the urology team. -I have contacted the urology resident, Dr. Federico Flake (only available urology provider listed on AMION), phone #[phone].  Dr. Claudean Kinds has indicated he will see the patient. -No nausea or vomiting. -We will follow cultures. -Continue hydration -Continue IV antibiotics  Further management will depend on hospital course.  Addendum: Patient reported bilateral numbness of the fingers today nursing staff. Check B12, folate, acute hepatitis panel, HbA1c and blood sugar. Further management will depend on hospital course Adequate pain control.

## 2019-04-12 NOTE — Progress Notes (Signed)
Pt continues to request a new attending or to be transferred to another hospital. Patient placement indicated MD must contact his director to have the pt reassigned. MD paged/aware. CN aware.

## 2019-04-12 NOTE — Progress Notes (Addendum)
-  Documentation by patient's nurse, Greer Pickerel, dated 04/12/2019, timed 3:13 PM, seen and reviewed. -For the records, patient was seen, examined, care plan discussed with the patient.  Patient's care was also discussed with urology team (Dr. Federico Flake, phone #[phone]).

## 2019-04-12 NOTE — Anesthesia Preprocedure Evaluation (Addendum)
Anesthesia Evaluation  Patient identified by MRN, date of birth, ID band Patient awake    Reviewed: Allergy & Precautions, NPO status , Patient's Chart, lab work & pertinent test results  Airway Mallampati: II  TM Distance: >3 FB Neck ROM: Full    Dental  (+) Teeth Intact, Dental Advisory Given   Pulmonary neg pulmonary ROS,    Pulmonary exam normal breath sounds clear to auscultation       Cardiovascular negative cardio ROS Normal cardiovascular exam Rhythm:Regular Rate:Normal     Neuro/Psych PSYCHIATRIC DISORDERS Anxiety Depression Bipolar Disorder Schizophrenia negative neurological ROS     GI/Hepatic negative GI ROS, Neg liver ROS,   Endo/Other  negative endocrine ROS  Renal/GU left ureteral obstruction      Musculoskeletal negative musculoskeletal ROS (+)   Abdominal   Peds  Hematology  (+) Blood dyscrasia, anemia ,   Anesthesia Other Findings Day of surgery medications reviewed with the patient.  Reproductive/Obstetrics                             Anesthesia Physical Anesthesia Plan  ASA: II and emergent  Anesthesia Plan: General   Post-op Pain Management:    Induction: Intravenous, Rapid sequence and Cricoid pressure planned  PONV Risk Score and Plan: 3 and Midazolam, Dexamethasone and Ondansetron  Airway Management Planned: Oral ETT  Additional Equipment:   Intra-op Plan:   Post-operative Plan: Extubation in OR  Informed Consent: I have reviewed the patients History and Physical, chart, labs and discussed the procedure including the risks, benefits and alternatives for the proposed anesthesia with the patient or authorized representative who has indicated his/her understanding and acceptance.     Dental advisory given  Plan Discussed with: CRNA  Anesthesia Plan Comments:        Anesthesia Quick Evaluation

## 2019-04-12 NOTE — H&P (Signed)
History and Physical    Kenneth Crawford KZL:[id number] DOB: 1985-08-19 DOA: 04/11/2019  PCP: Mar Daring, PA-C  Patient coming from: Home  Chief Complaint: Left flank pain  HPI: Kenneth Crawford is a 34 y.o. male with medical history significant of bipolar disorder comes in with over a week of bilateral flank pain more so on the left with gross hematuria without any diarrhea but vomiting at home.  Patient was recently evaluated in the emergency department yesterday and was started on Keflex.  Patient has vomited his Keflex over the last 24 hours several times.  He has significant amount of pain came back to the emergency department because of persistent pain and vomiting.  Urology saw him yesterday in the emergency department felt that his abnormality that he had on his CAT scan was more of a blood clot than a stone in his ureter.  He was unable to get the Flomax filled because he has an allergy to sulfa drugs.  Patient has received IV fluids and pain medications the emergency department and his pain is better.  He has been given Rocephin.  Patient referred for admission for acute pyelonephritis.  Review of Systems: As per HPI otherwise 10 point review of systems negative.   Past Medical History:  Diagnosis Date  . Anxiety   . Bipolar 1 disorder ([place])   . H1N1 influenza    2009    Past Surgical History:  Procedure Laterality Date  . ADENOIDECTOMY    . WISDOM TOOTH EXTRACTION       reports that he has never smoked. He has never used smokeless tobacco. He reports current alcohol use. He reports that he does not use drugs.  Allergies  Allergen Reactions  . 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  Joanell Rising [Quetiapine Fumarate]     "sore throat, blisters in throat, migraine, high levels of aggression"  . Sulfa Antibiotics Swelling  . Sertraline Rash    Family History  Problem Relation Age of Onset  .  Hypertension Father     Prior to Admission medications   Medication Sig Start Date End Date Taking? Authorizing Provider  Ascorbic Acid (VITAMIN C) 1000 MG tablet Take 1,000 mg by mouth 2 (two) times daily.   Yes [provider]  cephALEXin (KEFLEX) 500 MG capsule Take 1 capsule (500 mg total) by mouth 4 (four) times daily for 5 days. 04/11/19 04/16/19 Yes McDonald, Mia A, PA-C  ibuprofen (ADVIL) 200 MG tablet Take 400 mg by mouth every 6 (six) hours as needed for headache, mild pain, moderate pain or cramping.   Yes [provider]  oxyCODONE-acetaminophen (PERCOCET/ROXICET) 5-325 MG tablet Take 1 tablet by mouth every 8 (eight) hours as needed for severe pain. 04/11/19  Yes McDonald, Mia A, PA-C  tamsulosin (FLOMAX) 0.4 MG CAPS capsule Take 1 capsule (0.4 mg total) by mouth daily for 14 days. 04/11/19 04/25/19  Joline Maxcy A, PA-C    Physical Exam: Vitals:   04/12/19 0234 04/12/19 0239 04/12/19 0240 04/12/19 0248  BP:   108/66   Pulse:   83   Resp:   18   Temp: 99.9 F (37.7 C)  (!) 100.4 F (38 C)   TempSrc: Oral  Oral   SpO2:   97%   Weight:    73.2 kg  Height:  5\' 11"  (1.803 m)        Constitutional: NAD, calm, comfortable Vitals:   04/12/19  0234 04/12/19 0239 04/12/19 0240 04/12/19 0248  BP:   108/66   Pulse:   83   Resp:   18   Temp: 99.9 F (37.7 C)  (!) 100.4 F (38 C)   TempSrc: Oral  Oral   SpO2:   97%   Weight:    73.2 kg  Height:  5\' 11"  (1.803 m)     Eyes: PERRL, lids and conjunctivae normal ENMT: Mucous membranes are moist. Posterior pharynx clear of any exudate or lesions.Normal dentition.  Neck: normal, supple, no masses, no thyromegaly Respiratory: clear to auscultation bilaterally, no wheezing, no crackles. Normal respiratory effort. No accessory muscle use.  Cardiovascular: Regular rate and rhythm, no murmurs / rubs / gallops. No extremity edema. 2+ pedal pulses. No carotid bruits.  Abdomen: no tenderness, no masses palpated. No  hepatosplenomegaly. Bowel sounds positive.  Musculoskeletal: no clubbing / cyanosis. No joint deformity upper and lower extremities. Good ROM, no contractures. Normal muscle tone.  Skin: no rashes, lesions, ulcers. No induration Neurologic: CN 2-12 grossly intact. Sensation intact, DTR normal. Strength 5/5 in all 4.  Psychiatric: Normal judgment and insight. Alert and oriented x 3. Normal mood.    Labs on Admission: I have personally reviewed following labs and imaging studies  CBC: Recent Labs  Lab 04/11/19 0222 04/11/19 2333  WBC 7.0 14.5*  NEUTROABS 3.7 12.6*  HGB 13.3 14.0  HCT 39.2 41.8  MCV 84.1 84.3  PLT 293 248   Basic Metabolic Panel: Recent Labs  Lab 04/11/19 0222 04/11/19 2333  NA 139 135  K 3.8 4.3  CL 101 100  CO2 29 26  GLUCOSE 113* 102*  BUN 21* 17  CREATININE 1.40* 1.31*  CALCIUM 9.2 9.4   GFR: Estimated Creatinine Clearance: 84.6 mL/min (A) (by C-G formula based on SCr of 1.31 mg/dL (H)). Liver Function Tests: Recent Labs  Lab 04/11/19 0222 04/11/19 2333  AST 25 25  ALT 42 36  ALKPHOS 73 72  BILITOT 0.4 1.2  PROT 7.7 8.3*  ALBUMIN 4.4 4.7   No results for input(s): LIPASE, AMYLASE in the last 168 hours. No results for input(s): AMMONIA in the last 168 hours. Coagulation Profile: No results for input(s): INR, PROTIME in the last 168 hours. Cardiac Enzymes: No results for input(s): CKTOTAL, CKMB, CKMBINDEX, TROPONINI in the last 168 hours. BNP (last 3 results) No results for input(s): PROBNP in the last 8760 hours. HbA1C: No results for input(s): HGBA1C in the last 72 hours. CBG: No results for input(s): GLUCAP in the last 168 hours. Lipid Profile: No results for input(s): CHOL, HDL, LDLCALC, TRIG, CHOLHDL, LDLDIRECT in the last 72 hours. Thyroid Function Tests: No results for input(s): TSH, T4TOTAL, FREET4, T3FREE, THYROIDAB in the last 72 hours. Anemia Panel: No results for input(s): VITAMINB12, FOLATE, FERRITIN, TIBC, IRON,  RETICCTPCT in the last 72 hours. Urine analysis:    Component Value Date/Time   COLORURINE YELLOW 04/11/2019 2333   APPEARANCEUR HAZY (A) 04/11/2019 2333   LABSPEC 1.016 04/11/2019 2333   PHURINE 5.0 04/11/2019 2333   GLUCOSEU NEGATIVE 04/11/2019 2333   HGBUR LARGE (A) 04/11/2019 2333   BILIRUBINUR NEGATIVE 04/11/2019 2333   KETONESUR 20 (A) 04/11/2019 2333   PROTEINUR NEGATIVE 04/11/2019 2333   NITRITE NEGATIVE 04/11/2019 2333   LEUKOCYTESUR MODERATE (A) 04/11/2019 2333   Sepsis Labs: !!!!!!!!!!!!!!!!!!!!!!!!!!!!!!!!!!!!!!!!!!!! @LABRCNTIP (procalcitonin:4,lacticidven:4) )No results found for this or any previous visit (from the past 240 hour(s)).   Radiological Exams on Admission: CT Renal Stone Study  Result Date: 04/11/2019  CLINICAL DATA:  Hematuria. Bilateral flank pain. EXAM: CT ABDOMEN AND PELVIS WITHOUT CONTRAST TECHNIQUE: Multidetector CT imaging of the abdomen and pelvis was performed following the standard protocol without IV contrast. COMPARISON:  CT 04/15/2016 FINDINGS: Lower chest: The lung bases are clear. Hepatobiliary: No focal hepatic abnormality. Small gallstone within the gallbladder. No pericholecystic inflammation. No biliary dilatation. Pancreas: No ductal dilatation or inflammation. Spleen: Normal in size without focal abnormality. Adrenals/Urinary Tract: Normal adrenal glands. No right hydronephrosis or hydroureter. No right nephrolithiasis. Slight indistinctness of right perirenal fat. Mild left hydronephrosis. Vague high density in the left ureteropelvic junction and proximal ureter, series 2, images 32 and 33 the more distal ureter is decompressed. Calcification in the left pelvis represents a phlebolith, and was seen on prior exam. Urinary bladder is minimally distended. No bladder stone or wall thickening. No urethral stones visualized. Stomach/Bowel: Stomach is within normal limits. Appendix appears normal, best visualized on series 5, image 63. No evidence of  bowel wall thickening, distention, or inflammatory changes. Moderate stool in the proximal colon. Vascular/Lymphatic: Abdominal aorta is normal in caliber. No bulky abdominopelvic adenopathy. Reproductive: Prostate is unremarkable. Other: No free air, free fluid, or intra-abdominal fluid collection. Musculoskeletal: Scoliotic curvature of the spine. There are no acute or suspicious osseous abnormalities. IMPRESSION: 1. Mild left hydronephrosis with ill-defined high density in the left ureteropelvic junction and proximal ureter. High density may represent blood products or small stones. 2. Gallstone without gallbladder inflammation. Electronically Signed   By: Narda Rutherford M.D.   On: 04/11/2019 03:58   Old chart reviewed Case discussed with Dr. Elesa Massed in the emergency department  Assessment/Plan 34 year old male with acute pyelonephritis of left kidney with some mild left hydronephrosis Principal Problem:   Pyelonephritis of left kidney-placed on Rocephin 2 g IV every 24 hours.  Placed on fentanyl for pain and Zofran for nausea.  Abdominal exam is benign.  Urine culture was done yesterday and is pending.  Follow-up on urine culture and blood cultures.  If patient does not clinically improve with IV antibiotics IV fluids and IV antiemetics and pain medications consider calling urology again for repeat evaluation.  Not septic.  Active Problems:   Bipolar 1 disorder (HCC)-stable    Anxiety-stable    DVT prophylaxis: SCDs Code Status: Full Family Communication: None Disposition Plan: 1 to 3 days Consults called: None Admission status: Admission   Beuna Bolding A MD [facility]  If 7PM-7AM, please contact night-coverage [email or web address] Password [facility]  04/12/2019, 2:56 AM

## 2019-04-12 NOTE — Progress Notes (Signed)
Pt was awake and alert when I arrived. Pt was about to go to surgery and wanted HCPOA which was administered bedside of pt and notarized. Pt was removed from room immediately following notarization and before copies were made. The original and copies were left with unit secretary to be given to pt and placed in chart when pt returns from surgery.  Pt and fiance (who was on telephone) wanted prayer prior to surgery, which was also given bedside. Please page if you have any questions or needed additional information. [phone] Chaplain Elmarie Shiley, MDiv   04/12/19 1800  Clinical Encounter Type  Visited With Patient

## 2019-04-12 NOTE — Treatment Plan (Signed)
Urology Treatment Plan Note:  Please see consult note from 04/11/19 for details.  Evaluated patient evening of 1/24, appears to have infection and obstructed left kidney. Will take for ureteral stent emergently. Discussed plan with patient and fiance, both agreeable.

## 2019-04-12 NOTE — Op Note (Addendum)
.  Preoperative diagnosis: Left ureteral obstruction from clot  Postoperative diagnosis: Same  Procedure: 1 cystoscopy 2. Left retrograde pyelography 3.  Intraoperative fluoroscopy, under one hour, with interpretation 4. Left 6 x 26 JJ stent placement  Attending: Wilkie Aye, MD  Resident: Federico Flake, MD  Anesthesia: General  Estimated blood loss: None  Drains: Left 6 x 26 JJ ureteral stent without tether, 16 French foley catheter  Specimens:  none  Antibiotics: rocephin  Findings: Left ureteral filling defect at UPJ.  Moderate hydronephrosis. No masses/lesions in the bladder. Ureteral orifices in normal anatomic location.  Indications: Patient is a 34 year old male with a history of left renal stone and concern for sepsis.  After discussing treatment options, they decided proceed with left stent placement.  Procedure her in detail: The patient was brought to the operating room and a brief timeout was done to ensure correct patient, correct procedure, correct site.  General anesthesia was administered patient was placed in dorsal lithotomy position.  Their genitalia was then prepped and draped in usual sterile fashion.  A rigid 22 French cystoscope was passed in the urethra and the bladder.  Bladder was inspected free masses or lesions.  the ureteral orifices were in the normal orthotopic locations.  a 6 french ureteral catheter was then instilled into the left ureteral orifice.  a gentle retrograde was obtained and findings noted above.  we then placed a zip wire through the ureteral catheter and advanced up to the renal pelvis.    We then placed a 6 x 26 double-j ureteral stent over the original zip wire.  We then removed the wire and good coil was noted in the the renal pelvis under fluoroscopy and the bladder under direct vision.  A foley catheter was then placed. the bladder was then drained and this concluded the procedure which was well tolerated by patient.  Complications:  None  Condition: Stable, extubated, transferred to PACU  Plan: Patient is to be admitted for IV antibiotics. The stent will remain in place for 2 weeks prior to removal

## 2019-04-12 NOTE — Progress Notes (Signed)
Pt was awake and alert when I arrived and face timing with his fiance. He said he was feeling fine. I confirmed his receipt of his HCPA. He and his fiance were grateful for assistance. Please page if additional assistance is needed. Chaplain Elmarie Shiley, [facility]   04/12/19 1900  Clinical Encounter Type  Visited With Patient

## 2019-04-12 NOTE — Progress Notes (Addendum)
Pt c/o numbness and tingling of hands, shivering, cramping pain in left flank increased to 7/10, difficulty taking deep breaths. Respirations 22, pulse 102, BP 106/75, SpO2 100% RA, 98.1 temp. Pt requesting to be transferred to another hospital because he hasn't seen the MD yet other than to tell him he'll look at his chart and see him tomorrow per pt.

## 2019-04-12 NOTE — Progress Notes (Signed)
Pt ambulate in hall with NT. No issues at this time.

## 2019-04-13 ENCOUNTER — Encounter: Payer: Self-pay | Admitting: *Deleted

## 2019-04-13 LAB — RENAL FUNCTION PANEL
Albumin: 3.1 g/dL — ABNORMAL LOW (ref 3.5–5.0)
Anion gap: 6 (ref 5–15)
BUN: 9 mg/dL (ref 6–20)
CO2: 25 mmol/L (ref 22–32)
Calcium: 8.5 mg/dL — ABNORMAL LOW (ref 8.9–10.3)
Chloride: 108 mmol/L (ref 98–111)
Creatinine, Ser: 0.8 mg/dL (ref 0.61–1.24)
GFR calc Af Amer: >60 mL/min (ref >60)
GFR calc non Af Amer: >60 mL/min (ref >60)
Glucose, Bld: 116 mg/dL — ABNORMAL HIGH (ref 70–99)
Phosphorus: 2.6 mg/dL (ref 2.5–4.6)
Potassium: 4.2 mmol/L (ref 3.5–5.1)
Sodium: 139 mmol/L (ref 135–145)

## 2019-04-13 LAB — URINE CULTURE
Culture: >=100000 — AB
Culture: <10000 — AB

## 2019-04-13 LAB — CBC WITH DIFFERENTIAL/PLATELET
Abs Immature Granulocytes: 0.07 10*3/uL (ref 0.00–0.07)
Basophils Absolute: 0 10*3/uL (ref 0.0–0.1)
Basophils Relative: 0 %
Eosinophils Absolute: 0 10*3/uL (ref 0.0–0.5)
Eosinophils Relative: 0 %
HCT: 34.1 % — ABNORMAL LOW (ref 39.0–52.0)
Hemoglobin: 11.2 g/dL — ABNORMAL LOW (ref 13.0–17.0)
Immature Granulocytes: 1 %
Lymphocytes Relative: 10 %
Lymphs Abs: 0.9 10*3/uL (ref 0.7–4.0)
MCH: 28.1 pg (ref 26.0–34.0)
MCHC: 32.8 g/dL (ref 30.0–36.0)
MCV: 85.7 fL (ref 80.0–100.0)
Monocytes Absolute: 0.5 10*3/uL (ref 0.1–1.0)
Monocytes Relative: 5 %
Neutro Abs: 7.4 10*3/uL (ref 1.7–7.7)
Neutrophils Relative %: 84 %
Platelets: 238 10*3/uL (ref 150–400)
RBC: 3.98 MIL/uL — ABNORMAL LOW (ref 4.22–5.81)
RDW: 11.8 % (ref 11.5–15.5)
WBC: 8.8 10*3/uL (ref 4.0–10.5)
nRBC: 0 % (ref 0.0–0.2)

## 2019-04-13 LAB — MAGNESIUM: Magnesium: 2.1 mg/dL (ref 1.7–2.4)

## 2019-04-13 NOTE — Progress Notes (Signed)
Urology Progress Note   1 Day Post-Op s/p left ureteral stent placement  Subjective: Feels much better this morning.  Tolerating Foley catheter.  Left flank is sore but not nearly as painful as it was preprocedure.  No further fevers overnight.  Objective: Vital signs in last 24 hours: Temp:  [98.1 F (36.7 C)-100.1 F (37.8 C)] 98.9 F (37.2 C) (01/25 0527) Pulse Rate:  [64-102] 64 (01/25 0527) Resp:  [14-22] 16 (01/25 0527) BP: (106-121)/(72-83) 116/73 (01/25 0527) SpO2:  [99 %-100 %] 99 % (01/25 0527)  Intake/Output from previous day: 01/24 0701 - 01/25 0700 In: 3965.7 [P.O.:1010; I.V.:2855.7; IV Piggyback:100] Out: 2350 [Urine:2350] Intake/Output this shift: No intake/output data recorded.  Physical Exam:  General Appearance:  No acute distress.  Sleeping Pulmonary: Normal respiratory effort on room air Cardiovascular: Regular rate Abdomen: Soft, non-tender, without masses. Musculoskeletal: Extremities without edema. GU: Mild left CVA tenderness Neurologic:  No motor abnormalities noted.    Lab Results: Recent Labs    04/11/19 0222 04/11/19 2333 04/12/19 0453  HGB 13.3 14.0 11.8*  HCT 39.2 41.8 35.3*   BMET Recent Labs    04/11/19 2333 04/12/19 0453  NA 135 137  K 4.3 4.1  CL 100 105  CO2 26 26  GLUCOSE 102* 112*  BUN 17 14  CREATININE 1.31* 1.15  CALCIUM 9.4 8.4*     Studies/Results: DG C-Arm 1-60 Min-No Report  Result Date: 04/12/2019 Fluoroscopy was utilized by the requesting physician.  No radiographic interpretation.    Assessment/Plan:  34 y.o. male s/p left ureteral stent placement for obstructing left clot versus small stone.  E. coli urine culture with sensitivities pending.  Improving after stent placement.  -Follow-up results of urine culture and treat with 10 days of cultures to begin biotics -DC Foley, trial void -Ditropan as needed for bladder spasms.  Patient states he cannot take Flomax due to sulfa allergy but this is not a  known reaction to Flomax -Dr. Ronne Binning to request outpatient ureteroscopy, does not need clinic visit prior -Would recommend keeping patient here today   Dispo: Floor   LOS: 1 day   Federico Flake 04/13/2019, 7:29 AM

## 2019-04-13 NOTE — Anesthesia Postprocedure Evaluation (Signed)
Anesthesia Post Note  Patient: Kenneth Crawford  Procedure(s) Performed: CYSTOSCOPY WITH RETROGRADE PYELOGRAM/URETERAL STENT PLACEMENT (Left Ureter)     Patient location during evaluation: PACU Anesthesia Type: General Level of consciousness: awake and alert Pain management: pain level controlled Vital Signs Assessment: post-procedure vital signs reviewed and stable Respiratory status: spontaneous breathing, nonlabored ventilation and respiratory function stable Cardiovascular status: blood pressure returned to baseline and stable Postop Assessment: no apparent nausea or vomiting Anesthetic complications: no    Last Vitals:  Vitals:   04/12/19 2028 04/13/19 0527  BP: 117/72 116/73  Pulse: 65 64  Resp: 16 16  Temp: 36.7 C 37.2 C  SpO2: 100% 99%    Last Pain:  Vitals:   04/13/19 0551  TempSrc:   PainSc: 2                  Cecile Hearing

## 2019-04-13 NOTE — Progress Notes (Signed)
Patient's foley removed this morning 0910.  Patient has been voiding.  Will continue to monitor.

## 2019-04-13 NOTE — Plan of Care (Signed)
  Problem: Education: Goal: Knowledge of General Education information will improve Description: Including pain rating scale, medication(s)/side effects and non-pharmacologic comfort measures Outcome: Progressing   Problem: Health Behavior/Discharge Planning: Goal: Ability to manage health-related needs will improve Outcome: Progressing   Problem: Clinical Measurements: Goal: Respiratory complications will improve Outcome: Progressing Goal: Cardiovascular complication will be avoided Outcome: Progressing   Problem: Nutrition: Goal: Adequate nutrition will be maintained Outcome: Progressing   Problem: Safety: Goal: Ability to remain free from injury will improve Outcome: Progressing

## 2019-04-13 NOTE — Progress Notes (Signed)
PROGRESS NOTE    Athony Crawford  VHQ:[id number] DOB: 11/27/1985 DOA: 04/11/2019 PCP: Margaretann Loveless, PA-C  Outpatient Specialists:   Brief Narrative:  Patient is a 34 year old Caucasian male with past medical history significant for for bipolar disorder.  Patient was admitted with left-sided flank pain with associated gross hematuria.  There are concerns of patient's presentation and likely secondary to nephrolithiasis, with its associated complications.  Urine culture has grown E. coli.  Patient is on IV Rocephin.  Patient has also undergone stent placement by the urology team.  Urology team is assisting in directing patient's care.  04/13/2019: Patient seen alongside patient's nurse, Shon Hale.  Patient looks a lot better today.  No fever or chills.  No vomiting.  Patient reported some nausea.  Left flank pain has improved significantly.  Will decrease IV fluids from 125 cc/h to 75 cc/h.  We will continue IV antibiotics whilst patient is in the hospital.  On discharge, patient will need to complete 10 to 14-day course of antibiotics, but can be discharged on oral Keflex.  Patient will need to follow-up with urology on discharge.    Assessment & Plan:   Principal Problem:   Pyelonephritis of left kidney Active Problems:   Bipolar 1 disorder (HCC)   Anxiety  Complicated UTI/pyelonephritis/left-sided hydronephrosis/hematuria/nephrolithiasis: -See above documentation. -Complete course of antibiotics.  Urine culture grew E. Coli. -Continue hydration. -Discussed need for adequate hydration with patient, with urine output goal being 2 to 3 L. -Patient will follow with urology team on discharge.  Patient is status post stent placement.  Bipolar disorder: -Stable.  Continue to monitor  Anxiety: -Stable.  Continue to monitor  DVT prophylaxis: SCD Code Status: Full code Family Communication:  Disposition Plan: Discharge home eventually   Consultants:   Urology  Procedures:    Stent placement  Antimicrobials:   IV Rocephin   Subjective: No fever or chills Left flank pain has improved significantly No vomiting Patient reported some nausea.  Objective: Vitals:   04/12/19 1900 04/12/19 1903 04/12/19 2028 04/13/19 0527  BP:  119/73 117/72 116/73  Pulse: 85 79 65 64  Resp:  17 16 16   Temp:  98.5 F (36.9 C) 98.1 F (36.7 C) 98.9 F (37.2 C)  TempSrc:  Oral Oral Oral  SpO2: 100% 100% 100% 99%  Weight:      Height:        Intake/Output Summary (Last 24 hours) at 04/13/2019 1224 Last data filed at 04/13/2019 1131 Gross per 24 hour  Intake 3370.71 ml  Output 2675 ml  Net 695.71 ml   Filed Weights   04/12/19 0248  Weight: 73.2 kg    Examination:  General exam: Appears calm and comfortable  Respiratory system: Clear to auscultation. Respiratory effort normal. Cardiovascular system: S1 & S2 heard Gastrointestinal system: Abdomen is nondistended, soft and nontender. No organomegaly or masses felt. Normal bowel sounds heard.  Minimal left flank tenderness.  Central nervous system: Awake and alert.  Patient moves all extremities.   Extremities: No leg edema.  Data Reviewed: I have personally reviewed following labs and imaging studies  CBC: Recent Labs  Lab 04/11/19 0222 04/11/19 2333 04/12/19 0453 04/13/19 0902  WBC 7.0 14.5* 18.9* 8.8  NEUTROABS 3.7 12.6*  --  7.4  HGB 13.3 14.0 11.8* 11.2*  HCT 39.2 41.8 35.3* 34.1*  MCV 84.1 84.3 86.1 85.7  PLT 293 248 226 238   Basic Metabolic Panel: Recent Labs  Lab 04/11/19 0222 04/11/19 2333 04/12/19 0453 04/13/19 0902  NA 139 135 137 139  K 3.8 4.3 4.1 4.2  CL 101 100 105 108  CO2 29 26 26 25   GLUCOSE 113* 102* 112* 116*  BUN 21* 17 14 9   CREATININE 1.40* 1.31* 1.15 0.80  CALCIUM 9.2 9.4 8.4* 8.5*  MG  --   --   --  2.1  PHOS  --   --   --  2.6   GFR: Estimated Creatinine Clearance: 138.5 mL/min (by C-G formula based on SCr of 0.8 mg/dL). Liver Function Tests: Recent Labs   Lab 04/11/19 0222 04/11/19 2333 04/13/19 0902  AST 25 25  --   ALT 42 36  --   ALKPHOS 73 72  --   BILITOT 0.4 1.2  --   PROT 7.7 8.3*  --   ALBUMIN 4.4 4.7 3.1*   No results for input(s): LIPASE, AMYLASE in the last 168 hours. No results for input(s): AMMONIA in the last 168 hours. Coagulation Profile: No results for input(s): INR, PROTIME in the last 168 hours. Cardiac Enzymes: No results for input(s): CKTOTAL, CKMB, CKMBINDEX, TROPONINI in the last 168 hours. BNP (last 3 results) No results for input(s): PROBNP in the last 8760 hours. HbA1C: Recent Labs    04/12/19 1640  HGBA1C 4.8   CBG: Recent Labs  Lab 04/12/19 1621  GLUCAP 72   Lipid Profile: No results for input(s): CHOL, HDL, LDLCALC, TRIG, CHOLHDL, LDLDIRECT in the last 72 hours. Thyroid Function Tests: No results for input(s): TSH, T4TOTAL, FREET4, T3FREE, THYROIDAB in the last 72 hours. Anemia Panel: Recent Labs    04/12/19 1640  VITAMINB12 916*   Urine analysis:    Component Value Date/Time   COLORURINE YELLOW 04/11/2019 2333   APPEARANCEUR HAZY (A) 04/11/2019 2333   LABSPEC 1.016 04/11/2019 2333   PHURINE 5.0 04/11/2019 2333   GLUCOSEU NEGATIVE 04/11/2019 2333   HGBUR LARGE (A) 04/11/2019 2333   BILIRUBINUR NEGATIVE 04/11/2019 2333   KETONESUR 20 (A) 04/11/2019 2333   PROTEINUR NEGATIVE 04/11/2019 2333   NITRITE NEGATIVE 04/11/2019 2333   LEUKOCYTESUR MODERATE (A) 04/11/2019 2333   Sepsis Labs: @LABRCNTIP (procalcitonin:4,lacticidven:4)  ) Recent Results (from the past 240 hour(s))  Urine culture     Status: Abnormal   Collection Time: 04/11/19  4:56 AM   Specimen: Urine, Clean Catch  Result Value Ref Range Status   Specimen Description   Final    URINE, CLEAN CATCH Performed at [facility], [place] [address], [place], [place] [address]    Special Requests   Final    NONE Performed at [facility], [place] [address], [place], [place] [address]     Culture >=100,000 COLONIES/mL ESCHERICHIA COLI (A)  Final   Report Status 04/13/2019 FINAL  Final   Organism ID, Bacteria ESCHERICHIA COLI (A)  Final      Susceptibility   Escherichia coli - MIC*    AMPICILLIN >=32 RESISTANT Resistant     CEFAZOLIN <=4 SENSITIVE Sensitive     CEFTRIAXONE <=0.25 SENSITIVE Sensitive     CIPROFLOXACIN <=0.25 SENSITIVE Sensitive     GENTAMICIN <=1 SENSITIVE Sensitive     IMIPENEM <=0.25 SENSITIVE Sensitive     NITROFURANTOIN <=16 SENSITIVE Sensitive     TRIMETH/SULFA >=320 RESISTANT Resistant     AMPICILLIN/SULBACTAM >=32 RESISTANT Resistant     PIP/TAZO <=4 SENSITIVE Sensitive     * >=100,000 COLONIES/mL ESCHERICHIA COLI  Blood culture (routine x 2)     Status: None (Preliminary result)   Collection Time:  04/11/19 11:33 PM   Specimen: BLOOD  Result Value Ref Range Status   Specimen Description BLOOD RIGHT ANTECUBITAL  Final   Special Requests   Final    BOTTLES DRAWN AEROBIC AND ANAEROBIC Blood Culture adequate volume   Culture   Final    NO GROWTH 1 DAY Performed at [facility], [address], [place], [place] [address]    Report Status PENDING  Incomplete  Blood culture (routine x 2)     Status: None (Preliminary result)   Collection Time: 04/11/19 11:34 PM   Specimen: BLOOD  Result Value Ref Range Status   Specimen Description BLOOD LEFT ANTECUBITAL  Final   Special Requests   Final    BOTTLES DRAWN AEROBIC AND ANAEROBIC Blood Culture results may not be optimal due to an excessive volume of blood received in culture bottles   Culture   Final    NO GROWTH 1 DAY Performed at [facility], [address], [place], [place] [address]    Report Status PENDING  Incomplete  Urine culture     Status: Abnormal   Collection Time: 04/11/19 11:34 PM   Specimen: Urine, Clean Catch  Result Value Ref Range Status   Specimen Description   Final    URINE, CLEAN CATCH Performed at [facility], [address]  [place], [place] [address]    Special Requests   Final    NONE Performed at [facility], [address], [place], [place] [address]    Culture (A)  Final    <10,000 COLONIES/mL INSIGNIFICANT GROWTH Performed at [facility], [address] [place], [place] [address]    Report Status 04/13/2019 FINAL  Final  SARS CORONAVIRUS 2 (TAT 6-24 HRS) Nasopharyngeal Nasopharyngeal Swab     Status: None   Collection Time: 04/12/19  1:59 AM   Specimen: Nasopharyngeal Swab  Result Value Ref Range Status   SARS Coronavirus 2 NEGATIVE NEGATIVE Final    Comment: (NOTE) SARS-CoV-2 target nucleic acids are NOT DETECTED. The SARS-CoV-2 RNA is generally detectable in upper and lower respiratory specimens during the acute phase of infection. Negative results do not preclude SARS-CoV-2 infection, do not rule out co-infections with other pathogens, and should not be used as the sole basis for treatment or other patient management decisions. Negative results must be combined with clinical observations, patient history, and epidemiological information. The expected result is Negative. Fact Sheet for Patients: HairSlick.no Fact Sheet for Healthcare Providers: quierodirigir.com This test is not yet approved or cleared by the [place] FDA and  has been authorized for detection and/or diagnosis of SARS-CoV-2 by FDA under an Emergency Use Authorization (EUA). This EUA will remain  in effect (meaning this test can be used) for the duration of the COVID-19 declaration under Section 56 4(b)(1) of the Act, 21 U.S.C. section 360bbb-3(b)(1), unless the authorization is terminated or revoked sooner. Performed at [facility], [address] [place], [place] [address]          Radiology Studies: DG C-Arm 1-60 Min-No Report  Result Date: 04/12/2019 Fluoroscopy was utilized by the requesting physician.  No radiographic  interpretation.        Scheduled Meds: . Chlorhexidine Gluconate Cloth  6 each Topical Daily   Continuous Infusions: . sodium chloride 75 mL/hr at 04/13/19 1149  . cefTRIAXone (ROCEPHIN)  IV 2 g (04/13/19 0950)     LOS: 1 day    Time spent: 35  minutes    Berton Mount, MD  [facility] Pager #: [phone] 7PM-7AM contact night coverage as above

## 2019-04-14 ENCOUNTER — Telehealth: Payer: Self-pay | Admitting: Emergency Medicine

## 2019-04-14 NOTE — Progress Notes (Addendum)
PROGRESS NOTE    Kenneth Crawford  VOH:[id number] DOB: 1985/07/14 DOA: 04/11/2019 PCP: Mar Daring, PA-C  Outpatient Specialists:   Brief Narrative:  Patient is a 34 year old Caucasian male with past medical history significant for for bipolar disorder.  Patient was admitted with left-sided flank pain with associated gross hematuria.  There are concerns of patient's presentation and likely secondary to nephrolithiasis, with its associated complications.  Urine culture has grown E. coli.  Patient is on IV Rocephin.  Patient has also undergone stent placement by the urology team.  I discussed with the urologist and recommendation is for patient to discharge on p.o. fluoroquinolone . I discussed with patient about IV antibiotics for today and possible discharge home on p.o. ciprofloxacin tomorrow. He will need a follow-up appointment with urology.   Assessment & Plan:   Principal Problem:   Pyelonephritis of left kidney Active Problems:   Bipolar 1 disorder (HCC)   Anxiety  Complicated UTI/pyelonephritis/left-sided hydronephrosis/hematuria/nephrolithiasis: Urine culture grew E. coli-sensitive to third-generation cephalosporin resistant to ampicillin and Bactrim. Patient is currently on ceftriaxone 2 g every 24 hours We will continue antibiotics IV for today and possible transition to p.o. Levaquin at discharge tomorrow. Continue with IV fluid Patient will follow with urology team on discharge.  Patient is status post stent placement.  Bipolar disorder: -Stable.  Continue to monitor  Anxiety: -Stable.  Continue to monitor  DVT prophylaxis: SCD Code Status: Full code Family Communication: None at bedside Disposition Plan: Patient is from home will be discharged home. No barriers to discharge.    Consultants:   Urology  Procedures:   Stent placement  Antimicrobials:   IV Rocephin   Subjective: Patient was seen and examined at bedside.  Left flank pain is  controlled and patient is voiding without any issues.  Is alert and oriented in acute distress. Discussed plan for continue antibiotics IV hydration today and possible discharge home tomorrow on p.o. ciprofloxacin .  He will need to follow-up with urology as scheduled.  Objective: Vitals:   04/13/19 1327 04/13/19 2120 04/14/19 0455 04/14/19 1253  BP: 122/78 113/78 117/70 116/70  Pulse: 60 68 (!) 59 (!) 56  Resp: 16 20 16 18   Temp: 98.2 F (36.8 C) 98.7 F (37.1 C) 98 F (36.7 C) 98 F (36.7 C)  TempSrc: Oral Oral Oral Oral  SpO2: 100% 100% 100% 100%  Weight:   73.8 kg   Height:        Intake/Output Summary (Last 24 hours) at 04/14/2019 1637 Last data filed at 04/14/2019 1500 Gross per 24 hour  Intake 2297.2 ml  Output 1775 ml  Net 522.2 ml   Filed Weights   04/12/19 0248 04/14/19 0455  Weight: 73.2 kg 73.8 kg    Examination:  General exam: Appears calm and comfortable  Respiratory system: Clear to auscultation. Respiratory effort normal. Cardiovascular system: S1 & S2 heard Gastrointestinal system: Abdomen is nondistended, soft and nontender. No organomegaly or masses felt. Normal bowel sounds heard.  Minimal left flank tenderness.  Central nervous system: Awake and alert.  Patient moves all extremities.   Extremities: No leg edema.  Data Reviewed: I have personally reviewed following labs and imaging studies  CBC: Recent Labs  Lab 04/11/19 0222 04/11/19 2333 04/12/19 0453 04/13/19 0902  WBC 7.0 14.5* 18.9* 8.8  NEUTROABS 3.7 12.6*  --  7.4  HGB 13.3 14.0 11.8* 11.2*  HCT 39.2 41.8 35.3* 34.1*  MCV 84.1 84.3 86.1 85.7  PLT 293 248 226 238  Basic Metabolic Panel: Recent Labs  Lab 04/11/19 0222 04/11/19 2333 04/12/19 0453 04/13/19 0902  NA 139 135 137 139  K 3.8 4.3 4.1 4.2  CL 101 100 105 108  CO2 29 26 26 25   GLUCOSE 113* 102* 112* 116*  BUN 21* 17 14 9   CREATININE 1.40* 1.31* 1.15 0.80  CALCIUM 9.2 9.4 8.4* 8.5*  MG  --   --   --  2.1  PHOS  --    --   --  2.6   GFR: Estimated Creatinine Clearance: 139.7 mL/min (by C-G formula based on SCr of 0.8 mg/dL). Liver Function Tests: Recent Labs  Lab 04/11/19 0222 04/11/19 2333 04/13/19 0902  AST 25 25  --   ALT 42 36  --   ALKPHOS 73 72  --   BILITOT 0.4 1.2  --   PROT 7.7 8.3*  --   ALBUMIN 4.4 4.7 3.1*   No results for input(s): LIPASE, AMYLASE in the last 168 hours. No results for input(s): AMMONIA in the last 168 hours. Coagulation Profile: No results for input(s): INR, PROTIME in the last 168 hours. Cardiac Enzymes: No results for input(s): CKTOTAL, CKMB, CKMBINDEX, TROPONINI in the last 168 hours. BNP (last 3 results) No results for input(s): PROBNP in the last 8760 hours. HbA1C: Recent Labs    04/12/19 1640  HGBA1C 4.8   CBG: Recent Labs  Lab 04/12/19 1621  GLUCAP 72   Lipid Profile: No results for input(s): CHOL, HDL, LDLCALC, TRIG, CHOLHDL, LDLDIRECT in the last 72 hours. Thyroid Function Tests: No results for input(s): TSH, T4TOTAL, FREET4, T3FREE, THYROIDAB in the last 72 hours. Anemia Panel: Recent Labs    04/12/19 1640  VITAMINB12 916*   Urine analysis:    Component Value Date/Time   COLORURINE YELLOW 04/11/2019 2333   APPEARANCEUR HAZY (A) 04/11/2019 2333   LABSPEC 1.016 04/11/2019 2333   PHURINE 5.0 04/11/2019 2333   GLUCOSEU NEGATIVE 04/11/2019 2333   HGBUR LARGE (A) 04/11/2019 2333   BILIRUBINUR NEGATIVE 04/11/2019 2333   KETONESUR 20 (A) 04/11/2019 2333   PROTEINUR NEGATIVE 04/11/2019 2333   NITRITE NEGATIVE 04/11/2019 2333   LEUKOCYTESUR MODERATE (A) 04/11/2019 2333   Sepsis Labs: @LABRCNTIP (procalcitonin:4,lacticidven:4)  ) Recent Results (from the past 240 hour(s))  Urine culture     Status: Abnormal   Collection Time: 04/11/19  4:56 AM   Specimen: Urine, Clean Catch  Result Value Ref Range Status   Specimen Description   Final    URINE, CLEAN CATCH Performed at [facility], [address]  [place], [place]    Special Requests   Final    NONE Performed at [facility], [address], [place], [place]    Culture >=100,000 COLONIES/mL ESCHERICHIA COLI (A)  Final   Report Status 04/13/2019 FINAL  Final   Organism ID, Bacteria ESCHERICHIA COLI (A)  Final      Susceptibility   Escherichia coli - MIC*    AMPICILLIN >=32 RESISTANT Resistant     CEFAZOLIN <=4 SENSITIVE Sensitive     CEFTRIAXONE <=0.25 SENSITIVE Sensitive     CIPROFLOXACIN <=0.25 SENSITIVE Sensitive     GENTAMICIN <=1 SENSITIVE Sensitive     IMIPENEM <=0.25 SENSITIVE Sensitive     NITROFURANTOIN <=16 SENSITIVE Sensitive     TRIMETH/SULFA >=320 RESISTANT Resistant     AMPICILLIN/SULBACTAM >=32 RESISTANT Resistant     PIP/TAZO <=4 SENSITIVE Sensitive     * >=100,000 COLONIES/mL ESCHERICHIA COLI  Blood  culture (routine x 2)     Status: None (Preliminary result)   Collection Time: 04/11/19 11:33 PM   Specimen: BLOOD  Result Value Ref Range Status   Specimen Description BLOOD RIGHT ANTECUBITAL  Final   Special Requests   Final    BOTTLES DRAWN AEROBIC AND ANAEROBIC Blood Culture adequate volume   Culture   Final    NO GROWTH 2 DAYS Performed at [facility], [address], [place], [place] [address]    Report Status PENDING  Incomplete  Blood culture (routine x 2)     Status: None (Preliminary result)   Collection Time: 04/11/19 11:34 PM   Specimen: BLOOD  Result Value Ref Range Status   Specimen Description BLOOD LEFT ANTECUBITAL  Final   Special Requests   Final    BOTTLES DRAWN AEROBIC AND ANAEROBIC Blood Culture results may not be optimal due to an excessive volume of blood received in culture bottles   Culture   Final    NO GROWTH 2 DAYS Performed at [facility], [address], [place], [place] [address]    Report Status PENDING  Incomplete  Urine culture     Status: Abnormal   Collection Time: 04/11/19 11:34 PM   Specimen: Urine, Clean Catch    Result Value Ref Range Status   Specimen Description   Final    URINE, CLEAN CATCH Performed at [facility], [address], [place], [place] [address]    Special Requests   Final    NONE Performed at [facility], [address], [place], [place] [address]    Culture (A)  Final    <10,000 COLONIES/mL INSIGNIFICANT GROWTH Performed at [facility], [address] [place], [place] [address]    Report Status 04/13/2019 FINAL  Final  SARS CORONAVIRUS 2 (TAT 6-24 HRS) Nasopharyngeal Nasopharyngeal Swab     Status: None   Collection Time: 04/12/19  1:59 AM   Specimen: Nasopharyngeal Swab  Result Value Ref Range Status   SARS Coronavirus 2 NEGATIVE NEGATIVE Final    Comment: (NOTE) SARS-CoV-2 target nucleic acids are NOT DETECTED. The SARS-CoV-2 RNA is generally detectable in upper and lower respiratory specimens during the acute phase of infection. Negative results do not preclude SARS-CoV-2 infection, do not rule out co-infections with other pathogens, and should not be used as the sole basis for treatment or other patient management decisions. Negative results must be combined with clinical observations, patient history, and epidemiological information. The expected result is Negative. Fact Sheet for Patients: HairSlick.no Fact Sheet for Healthcare Providers: quierodirigir.com This test is not yet approved or cleared by the [place] FDA and  has been authorized for detection and/or diagnosis of SARS-CoV-2 by FDA under an Emergency Use Authorization (EUA). This EUA will remain  in effect (meaning this test can be used) for the duration of the COVID-19 declaration under Section 56 4(b)(1) of the Act, 21 U.S.C. section 360bbb-3(b)(1), unless the authorization is terminated or revoked sooner. Performed at [facility], [address] [place], [place] [address]           Radiology Studies: DG C-Arm 1-60 Min-No Report  Result Date: 04/12/2019 Fluoroscopy was utilized by the requesting physician.  No radiographic interpretation.        Scheduled Meds: . Chlorhexidine Gluconate Cloth  6 each Topical Daily   Continuous Infusions: . sodium chloride 75 mL/hr at 04/13/19 1730  . cefTRIAXone (ROCEPHIN)  IV  2 g (04/14/19 0912)     LOS: 2 days    Time spent: 35 minutes    Sharol Roussel, MD [facility] Pager #: [phone]  7PM-7AM contact night coverage as above

## 2019-04-14 NOTE — Telephone Encounter (Signed)
Post ED Visit - Positive Culture Follow-up: Successful Patient Follow-Up  Culture assessed and recommendations reviewed by:  []  , Pharm.D. []  Enzo Bi, Pharm.D., BCPS AQ-ID []  , Pharm.D., BCPS []  Celedonio Miyamoto, Pharm.D., BCPS []  [place], Garvin Fila.D., BCPS, AAHIVP []  , Pharm.D., BCPS, AAHIVP []  Georgina Pillion, PharmD, BCPS []  , PharmD, BCPS []  [place], PharmD, BCPS []  [place], PharmD  Positive urine culture  []  Patient discharged without antimicrobial prescription and treatment is now indicated []  Organism is resistant to prescribed ED discharge antimicrobial []  Patient with positive blood cultures  Patient is currently admitted to [facility] and urology is following and recommending discharge on ciprofloxacin   Estella Husk 04/14/2019, 1:03 PM

## 2019-04-14 NOTE — Treatment Plan (Signed)
Urology Treatment Plan Note:  Patient was seen yesterday afternoon.  Passed trial void.  Has been afebrile for greater than 24 hours, white count downtrending.  Urine culture from initial presentation growing E. coli.  Resistant to Bactrim.  Discussed need for outpatient ureteroscopy with the patient.  Explained that we will call him to schedule in the next couple weeks and that he will need a coronavirus test preoperatively.  He does not need another urine sample.  Recommendations -Okay to discharge from urology perspective -Would recommend 2 weeks of CIPROFLOXACIN.   Although urine cultures are sensitive to Ancef second generation cephalosporins are not ideal for a renal infection -We will call the patient to schedule his outpatient surgery.  Dispo: Floor   LOS: 2 days   Kenneth Crawford 04/14/2019, 8:01 AM

## 2019-04-15 LAB — FOLATE RBC
Folate, Hemolysate: 366 ng/mL
Folate, RBC: 899 ng/mL (ref >498)
Hematocrit: 40.7 % (ref 37.5–51.0)

## 2019-04-15 MED ORDER — OXYBUTYNIN CHLORIDE 5 MG PO TABS: 5.0000 mg | ORAL_TABLET | Freq: Three times a day (TID) | ORAL | 0 refills | Status: DC | PRN

## 2019-04-15 MED ORDER — CIPROFLOXACIN HCL 500 MG PO TABS
500.0000 mg | ORAL_TABLET | Freq: Two times a day (BID) | ORAL | Status: DC
  Administered 2019-04-15: 500 mg via ORAL
  Filled 2019-04-15: qty 1

## 2019-04-15 MED ORDER — OXYCODONE-ACETAMINOPHEN 5-325 MG PO TABS: 1.0000 | ORAL_TABLET | Freq: Four times a day (QID) | ORAL | 0 refills | Status: AC | PRN

## 2019-04-15 MED ORDER — VITAMIN C 1000 MG PO TABS: 1000.0000 mg | ORAL_TABLET | Freq: Two times a day (BID) | ORAL | 0 refills | Status: DC

## 2019-04-15 MED ORDER — TAMSULOSIN HCL 0.4 MG PO CAPS: 0.4000 mg | ORAL_CAPSULE | Freq: Every day | ORAL | 0 refills | Status: AC

## 2019-04-15 MED ORDER — CIPROFLOXACIN HCL 500 MG PO TABS: 500.0000 mg | ORAL_TABLET | Freq: Two times a day (BID) | ORAL | 0 refills | Status: DC

## 2019-04-15 NOTE — Progress Notes (Signed)
Discharge instructions and medication list reviewed with pt. No questions or concerns at this time.

## 2019-04-15 NOTE — Discharge Summary (Signed)
Physician Discharge Summary  Patient ID: Kenneth Crawford MRN: [id number] DOB/AGE: 1985-05-23 34 y.o.  Admit date: 04/11/2019 Discharge date: 04/15/2019  Admission Diagnoses:  Discharge Diagnoses:  Principal Problem:   Pyelonephritis of left kidney Active Problems:   Bipolar 1 disorder (HCC)   Anxiety   Discharged Condition: stable  Hospital Course:  Kenneth Crawford is a 34 y.o. male with medical history significant of bipolar disorder comes in with over a week of bilateral flank pain more so on the left with gross hematuria without any diarrhea but vomiting at home.  Patient was recently evaluated in the emergency department yesterday and was started on Keflex.  Patient has vomited his Keflex over the last 24 hours several times.  He has significant amount of pain came back to the emergency department because of persistent pain and vomiting.  Urology saw him yesterday in the emergency department felt that his abnormality that he had on his CAT scan was more of a blood clot than a stone in his ureter.  He was unable to get the Flomax filled because he has an allergy to sulfa drugs.  Patient has received IV fluids and pain medications in  the emergency department and his pain is better.  He has been given Rocephin.  Patient referred for admission for acute pyelonephritis. He had cystoscopy and stent placement by urology. He was treated with rocephine 2 g Q 24 hourly and transitioned to p.o ciprofloxacin 500 mg BID x 10 days.  Principal Problem:   Pyelonephritis of left kidney Active Problems:   Bipolar 1 disorder (HCC)   Anxiety  Complicated UTI/pyelonephritis/left-sided hydronephrosis/hematuria/nephrolithiasis: Urine culture grew E. coli-sensitive to third-generation cephalosporin resistant to ampicillin and Bactrim.  He is status post stent placement by urology Patient was treated with ceftriaxone 2 g every 24 hourly x3 days and transition to p.o. Cipro 500 mg twice daily for 10  days. He is to follow-up with urology clinic as scheduled.  Bipolar disorder: -Stable.  Continue to monitor  Anxiety: -Stable.   Consults: Urology  Significant Diagnostic Studies: Urinalysis with urine culture positive for E. coli sensitive to cephalosporins, gentamicin and fluoroquinolones but resistant to Bactrim and ampicillin.  Treatments: IV ceftriaxone 2 g every 24 hourly and transition to p.o. Cipro 500 mg twice daily x10 days.  IV fluid  Discharge Exam: Blood pressure (!) 131/93, pulse (!) 56, temperature 98.4 F (36.9 C), temperature source Oral, resp. rate 16, height 5\' 11"  (1.803 m), weight 72.6 kg, SpO2 100 %. General appearance: alert, cooperative, appears stated age and no distress Nose: Nares normal. Septum midline. Mucosa normal. No drainage or sinus tenderness. Neck: no adenopathy, no carotid bruit, no JVD, supple, symmetrical, trachea midline and thyroid not enlarged, symmetric, no tenderness/mass/nodules Resp: clear to auscultation bilaterally Cardio: regular rate and rhythm, S1, S2 normal, no murmur, click, rub or gallop GI: soft, non-tender; bowel sounds normal; no masses,  no organomegaly Extremities: extremities normal, atraumatic, no cyanosis or edema Skin: Skin color, texture, turgor normal. No rashes or lesions Neurologic: Alert and oriented X 3, normal strength and tone. Normal symmetric reflexes. Normal coordination and gait  Disposition:    Allergies as of 04/15/2019      Reactions   Other Other (See Comments)   Allergic to mangos,kiwi, cherries, pistachios- pt states throat closes up.   Seroquel [quetiapine Fumarate]    "sore throat, blisters in throat, migraine, high levels of aggression"   Sulfa Antibiotics Swelling   Sertraline Rash      Medication List  STOP taking these medications   cephALEXin 500 MG capsule Commonly known as: KEFLEX   ibuprofen 200 MG tablet Commonly known as: ADVIL     TAKE these medications   ciprofloxacin  500 MG tablet Commonly known as: CIPRO Take 1 tablet (500 mg total) by mouth 2 (two) times daily.   oxybutynin 5 MG tablet Commonly known as: DITROPAN Take 1 tablet (5 mg total) by mouth 3 (three) times daily as needed for bladder spasms.   oxyCODONE-acetaminophen 5-325 MG tablet Commonly known as: PERCOCET/ROXICET Take 1 tablet by mouth every 6 (six) hours as needed for up to 5 days for severe pain. What changed: when to take this   tamsulosin 0.4 MG Caps capsule Commonly known as: FLOMAX Take 1 capsule (0.4 mg total) by mouth daily for 14 days.   vitamin C 1000 MG tablet Take 1 tablet (1,000 mg total) by mouth 2 (two) times daily.      Total time spent on this discharge encounter is 39 minutes  Signed: Elie Confer 04/15/2019, 1:29 PM

## 2019-04-17 LAB — CULTURE, BLOOD (ROUTINE X 2)
Culture: NO GROWTH
Culture: NO GROWTH
Special Requests: ADEQUATE

## 2019-07-06 ENCOUNTER — Encounter (HOSPITAL_COMMUNITY): Payer: Self-pay | Admitting: *Deleted

## 2019-07-06 ENCOUNTER — Emergency Department (HOSPITAL_COMMUNITY): Payer: No Typology Code available for payment source

## 2019-07-06 ENCOUNTER — Other Ambulatory Visit: Payer: Self-pay

## 2019-07-06 ENCOUNTER — Emergency Department (HOSPITAL_COMMUNITY)
Admission: EM | Admit: 2019-07-06 | Discharge: 2019-07-06 | Disposition: A | Discharge status: Home / Self Care | Payer: No Typology Code available for payment source | Attending: Emergency Medicine | Admitting: Emergency Medicine

## 2019-07-06 DIAGNOSIS — Z79899 Other long term (current) drug therapy: Secondary | ICD-10-CM | POA: Insufficient documentation

## 2019-07-06 DIAGNOSIS — S46911A Strain of unspecified muscle, fascia and tendon at shoulder and upper arm level, right arm, initial encounter: Secondary | ICD-10-CM | POA: Diagnosis not present

## 2019-07-06 DIAGNOSIS — S161XXA Strain of muscle, fascia and tendon at neck level, initial encounter: Secondary | ICD-10-CM | POA: Diagnosis not present

## 2019-07-06 DIAGNOSIS — Y92410 Unspecified street and highway as the place of occurrence of the external cause: Secondary | ICD-10-CM | POA: Insufficient documentation

## 2019-07-06 DIAGNOSIS — Y9389 Activity, other specified: Secondary | ICD-10-CM | POA: Diagnosis not present

## 2019-07-06 DIAGNOSIS — Y999 Unspecified external cause status: Secondary | ICD-10-CM | POA: Insufficient documentation

## 2019-07-06 DIAGNOSIS — S0990XA Unspecified injury of head, initial encounter: Secondary | ICD-10-CM | POA: Diagnosis present

## 2019-07-06 MED ORDER — METHOCARBAMOL 500 MG PO TABS: 500.0000 mg | ORAL_TABLET | Freq: Two times a day (BID) | ORAL | 0 refills | Status: DC
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
## 2019-07-06 NOTE — ED Notes (Signed)
An After Visit Summary was printed and given to the patient. Discharge instructions given and no further questions at this time.  
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
## 2019-09-17 ENCOUNTER — Ambulatory Visit (INDEPENDENT_AMBULATORY_CARE_PROVIDER_SITE_OTHER): Payer: Self-pay | Admitting: Physician Assistant

## 2019-09-17 ENCOUNTER — Encounter: Payer: Self-pay | Admitting: Physician Assistant

## 2019-09-17 ENCOUNTER — Other Ambulatory Visit: Payer: Self-pay

## 2019-09-17 DIAGNOSIS — S134XXS Sprain of ligaments of cervical spine, sequela: Secondary | ICD-10-CM

## 2019-09-17 DIAGNOSIS — N200 Calculus of kidney: Secondary | ICD-10-CM

## 2019-09-17 DIAGNOSIS — Z87448 Personal history of other diseases of urinary system: Secondary | ICD-10-CM

## 2019-09-17 DIAGNOSIS — Z9109 Other allergy status, other than to drugs and biological substances: Secondary | ICD-10-CM
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
## 2019-09-27 ENCOUNTER — Encounter: Payer: Self-pay | Admitting: Physician Assistant
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
## 2023-01-14 ENCOUNTER — Telehealth: Payer: Self-pay | Admitting: Physician Assistant

## 2023-01-15 ENCOUNTER — Encounter: Payer: Self-pay | Admitting: Physician Assistant

## 2023-01-15 ENCOUNTER — Ambulatory Visit (INDEPENDENT_AMBULATORY_CARE_PROVIDER_SITE_OTHER): Payer: Self-pay | Admitting: Physician Assistant

## 2023-01-15 VITALS — BP 112/77 | HR 69 | Ht 71.0 in | Wt 178.5 lb

## 2023-01-15 DIAGNOSIS — F419 Anxiety disorder, unspecified: Secondary | ICD-10-CM

## 2023-01-15 DIAGNOSIS — G4489 Other headache syndrome: Secondary | ICD-10-CM | POA: Insufficient documentation

## 2023-01-15 DIAGNOSIS — Z7689 Persons encountering health services in other specified circumstances: Secondary | ICD-10-CM

## 2023-01-15 DIAGNOSIS — F32A Depression, unspecified: Secondary | ICD-10-CM | POA: Insufficient documentation

## 2023-01-15 DIAGNOSIS — M542 Cervicalgia: Secondary | ICD-10-CM | POA: Insufficient documentation

## 2023-01-15 DIAGNOSIS — M546 Pain in thoracic spine: Secondary | ICD-10-CM | POA: Insufficient documentation

## 2023-01-15 MED ORDER — SUMATRIPTAN SUCCINATE 50 MG PO TABS: 50.0000 mg | ORAL_TABLET | ORAL | 0 refills | Status: AC | PRN

## 2023-01-15 NOTE — Progress Notes (Signed)
New patient visit  Patient: Kenneth Crawford   DOB: [birth year]   37 y.o. Male  MRN: [id number] Visit Date: 01/15/2023  Today's healthcare provider: Debera Lat, PA-C   Chief Complaint  Patient presents with   New Patient (Initial Visit)    Patient reports he has been getting chronic migraines and anxiety related to the migraines.    Migraine    Reports they went away for a while but returned in April. Associated with shoulder pain    Anxiety    Feels as if it is migraine related. GAD- 7 score 18   Back Pain   Subjective    Kenneth Crawford is a 36 y.o. male who presents today as a new patient to establish care.   HPI     New Patient (Initial Visit)    Additional comments: Patient reports he has been getting chronic migraines and anxiety related to the migraines.         Migraine    Additional comments: Reports they went away for a while but returned in April. Associated with shoulder pain         Anxiety    Additional comments: Feels as if it is migraine related. GAD- 7 score 18        Back Pain   This is a new problem.  There was an injury that may have caused the pain.  Recent episode started more than a year ago.  The problem has been gradually worsening since onset.  Pain is thoracic spine.  The quality of pain is described as pinching and throbbing (tension).  Severity of the pain is moderate (mild to moderate).  Pain occurs every few days.  Symptoms worse in nighttime.  Aggravating factors: Posture and activity.  Treatment provided no relief.  Abdominal Pain: Absent.  Bowel incontinence: Absent.  Chest pain: Present (Sternum area).  Dysuria: Absent.  Fever: Absent.  Headaches: Absent.  Joint pains: Present.  Weakness in leg: Absent.  Pelvic pain: Absent.  Tingling in lower extremities: Absent.  Urinary incontinence: Absent.  Weight loss: Absent.      Last edited by Acey Lav, CMA on 01/15/2023 10:53 AM.      Discussed the use of AI scribe  software for clinical note transcription with the patient, who gave verbal consent to proceed.  History of Present Illness   The patient, a nurse, presents with recurrent migraines occurring two to three times a week. These migraines are described as throbbing pain that starts on one side of the head but eventually encompasses the whole head. Accompanying symptoms include nausea, sensitivity to light and sound, and the need to wear a sleep mask. The patient has tried Excedrin for relief, but it only provides temporary respite.  In addition to migraines, the patient also experiences neck pain, which he believes is worsening due to poor neck posture. He suspects that this neck pain might be a precursor to his migraines. He also reports lower thoracic back pain.  The patient also reports having anxiety and depression. The anxiety is primarily related to his migraines, as he worries about how they will affect his daily activities. The depression sets in when he is unable to accomplish tasks due to his migraines.  The patient suspects that he might have Ehlers-Danlos syndrome, a connective tissue disorder. He reports having very flexible joints and has been told by multiple healthcare professionals that he might have this condition. 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  01/15/2023   10:50 AM  PHQ9 SCORE ONLY  PHQ-9 Total Score 15      01/15/2023   10:50 AM  GAD 7 : Generalized Anxiety Score  Nervous, Anxious, on Edge 3  Control/stop worrying 3  Worry too much - different things 3  Trouble relaxing 3  Restless 2  Easily annoyed or irritable 2  Afraid - awful might happen 2  Total GAD 7 Score 18  Anxiety Difficulty Somewhat difficult       Past Medical History:  Diagnosis Date   Allergy 04/09   Anxiety    Bipolar 1 disorder (HCC)    Depression 04/09   H1N1 influenza    2009   Past Surgical History:  Procedure Laterality Date    ADENOIDECTOMY     CYSTOSCOPY W/ URETERAL STENT PLACEMENT Left 04/12/2019   Procedure: CYSTOSCOPY WITH RETROGRADE PYELOGRAM/URETERAL STENT PLACEMENT;  Surgeon: Malen Gauze, MD;  Location: [facility];  Service: Urology;  Laterality: Left;   WISDOM TOOTH EXTRACTION     Family Status  Relation Name Status   Mother  Alive   Father Adair Patter   [facility] Hilda Lias (Not Specified)   [employer] Hole (Not Specified)   [employer]/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)  No partnership data on file   Family History  Problem Relation Age of Onset   Hypertension Father    Depression Maternal Grandmother    Varicose Veins Paternal Grandmother    Anxiety disorder Maternal Aunt    Depression Maternal Aunt    Social History   Socioeconomic History   Marital status: Single    Spouse name: Not on file   Number of children: Not on file   Years of education: Not on file   Highest education level: Associate degree: occupational, Scientist, product/process development, or vocational program  Occupational History   Not on file  Tobacco Use   Smoking status: Never   Smokeless tobacco: Never  Vaping Use   Vaping status: Never Used  Substance and Sexual Activity   Alcohol use: Not Currently    Comment: occ   Drug use: No   Sexual activity: Yes    Birth control/protection: Condom, None    Comment: Ive been with the same partner since 2018  Other Topics Concern   Not on file  Social History Narrative   Not on file   Social Determinants of Health   Financial Resource Strain: Medium Risk (01/09/2023)   Overall Financial Resource Strain (CARDIA)    Difficulty of Paying Living Expenses: Somewhat hard  Food Insecurity: No Food Insecurity (01/09/2023)   Hunger Vital Sign    Worried About Running Out of Food in the Last Year: Never true    Ran Out of Food in the Last Year: Never true  Transportation Needs: No Transportation Needs (01/09/2023)   PRAPARE - Administrator, Civil Service (Medical): No    Lack of Transportation (Non-Medical): No   Physical Activity: Sufficiently Active (01/09/2023)   Exercise Vital Sign    Days of Exercise per Week: 5 days    Minutes of Exercise per Session: 30 min  Stress: Stress Concern Present (01/09/2023)   [employer] of Occupational Health - Occupational Stress Questionnaire    Feeling of Stress : Very much  Social Connections: Moderately Integrated (01/09/2023)   Social Connection and Isolation Panel [NHANES]    Frequency of Communication with Friends and Family: More than three times a week    Frequency of Social Gatherings with Friends and Family: More  than three times a week    Attends Religious Services: More than 4 times per year    Active Member of Clubs or Organizations: No    Attends Banker Meetings: Not on file    Marital Status: Living with partner   No outpatient medications prior to visit.   No facility-administered medications prior to visit.   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   Seroquel [Quetiapine Fumarate]     "sore throat, blisters in throat, migraine, high levels of aggression"   Sulfa Antibiotics Swelling   Sertraline Rash    Immunization History  Administered Date(s) Administered   Tdap 09/02/2016    Health Maintenance  Topic Date Due   COVID-19 Vaccine (1 - 2023-24 season) Never done   INFLUENZA VACCINE  06/17/2023 (Originally 10/18/2022)   DTaP/Tdap/Td (2 - Td or Tdap) 09/03/2026   Hepatitis C Screening  Completed   HIV Screening  Completed   HPV VACCINES  Aged Out    Patient Care Team: Debera Lat, PA-C as PCP - General (Physician Assistant)  Review of Systems  All other systems reviewed and are negative.  Except see HPI       Objective    BP 112/77 (BP Location: Left Arm, Patient Position: Sitting, Cuff Size: Normal)   Pulse 69   Ht 5\' 11"  (1.803 m)   Wt 178 lb 8 oz (81 kg)    SpO2 100%   BMI 24.90 kg/m     Physical Exam Vitals reviewed.  Constitutional:      General: He is not in acute distress.    Appearance: Normal appearance. He is not diaphoretic.  HENT:     Head: Normocephalic and atraumatic.  Eyes:     General: No scleral icterus.    Conjunctiva/sclera: Conjunctivae normal.  Cardiovascular:     Rate and Rhythm: Normal rate and regular rhythm.     Pulses: Normal pulses.     Heart sounds: Normal heart sounds. No murmur heard. Pulmonary:     Effort: Pulmonary effort is normal. No respiratory distress.     Breath sounds: Normal breath sounds. No wheezing or rhonchi.  Musculoskeletal:     Cervical back: Neck supple.     Right lower leg: No edema.     Left lower leg: No edema.  Lymphadenopathy:     Cervical: No cervical adenopathy.  Skin:    General: Skin is warm and dry.     Findings: No rash.  Neurological:     Mental Status: He is alert and oriented to person, place, and time. Mental status is at baseline.  Psychiatric:        Mood and Affect: Mood normal.        Behavior: Behavior normal.     Depression Screen    01/15/2023   10:50 AM  PHQ 2/9 Scores  PHQ - 2 Score 2  PHQ- 9 Score 15   No results found for any visits on 01/15/23.  Assessment & Plan        1. Other headache syndrome Could be Migraines Frequent migraines occurring 2-3 times a week, causing significant disruption to daily activities and contributing to anxiety. No aura or pre-migraine symptoms reported. Possible connection to neck pain and suspected Ehlers-Danlos syndrome. -Continue sumatriptan as needed for migraines. -Refer to neurology for further evaluation. - SUMAtriptan (IMITREX) 50 MG tablet; Take  1 tablet (50 mg total) by mouth every 2 (two) hours as needed for migraine. May repeat in 2 hours if headache persists or recurs.  Dispense: 10 tablet; Refill: 0 - AMB referral to orthopedics - Ambulatory referral to Neurology  2. Neck pain 3. Lower thoracic  back pain Could be due to Ehlers-Danlos Syndrome Suspected diagnosis based on hypermobility and joint pain. Possible contribution to migraines and neck pain. -Refer to orthopedics for comprehensive evaluation and management plan. - AMB referral to orthopedics  4. Encounter to establish care Welcomed to our clinic Reviewed past medical hx, social hx, family hx and surgical hx Pt advised to send all vaccination records or screening - AMB referral to orthopedics - Ambulatory referral to Neurology  Anxiety and Depression Anxiety related to frequent migraines and his impact on daily activities, leading to depressive symptoms when activities are disrupted. -Consider therapy for anxiety and depression management. -Consider antidepressants if symptoms persist or worsen. Follow-up in 6 weeks to assess progress and adjust management plan as necessary.     Return for CPE. Will do some labs for baseline/cmp, cbc   The patient was advised to call back or seek an in-person evaluation if the symptoms worsen or if the condition fails to improve as anticipated.  I discussed the assessment and treatment plan with the patient. The patient was provided an opportunity to ask questions and all were answered. The patient agreed with the plan and demonstrated an understanding of the instructions.  I, Debera Lat, PA-C have reviewed all documentation for this visit. The documentation on  01/15/23 for the exam, diagnosis, procedures, and orders are all accurate and complete.  Debera Lat, [facility], MMS [facility] [phone] (phone) [phone] (fax)  [facility]

## 2023-02-12 ENCOUNTER — Ambulatory Visit: Payer: Self-pay | Admitting: Orthopaedic Surgery

## 2023-03-05 ENCOUNTER — Encounter: Payer: Self-pay | Admitting: Physician Assistant

## 2024-01-20 ENCOUNTER — Encounter: Payer: Self-pay | Admitting: Radiology
